# Patient Record
Sex: Male | Born: 1990 | Race: Black or African American | Hispanic: No | Marital: Married | Smoking: Current every day smoker
Health system: Southern US, Community
[De-identification: ages and names within clinical notes are randomized; demographics above are authoritative.]

---
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
## 2010-08-26 ENCOUNTER — Emergency Department: Payer: Self-pay | Admitting: Emergency Medicine

## 2013-07-29 ENCOUNTER — Emergency Department: Payer: Self-pay | Admitting: Emergency Medicine

## 2013-07-29 LAB — RAPID INFLUENZA A&B ANTIGENS

## 2015-04-05 ENCOUNTER — Encounter: Payer: Self-pay | Admitting: Emergency Medicine

## 2015-04-05 ENCOUNTER — Emergency Department
Admission: EM | Admit: 2015-04-05 | Discharge: 2015-04-05 | Disposition: A | Discharge status: Home / Self Care | Payer: Self-pay | Attending: Emergency Medicine | Admitting: Emergency Medicine

## 2015-04-05 DIAGNOSIS — Z720 Tobacco use: Secondary | ICD-10-CM | POA: Insufficient documentation

## 2015-04-05 DIAGNOSIS — G5791 Unspecified mononeuropathy of right lower limb: Secondary | ICD-10-CM | POA: Insufficient documentation

## 2015-04-05 MED ORDER — METHYLPREDNISOLONE 4 MG PO TBPK: ORAL_TABLET | ORAL | Status: DC

## 2015-04-05 NOTE — ED Notes (Signed)
NAD noted at time of D/C. Pt ambulatory to the lobby at this time. Denies comments/concerns at this time.

## 2015-04-05 NOTE — ED Notes (Signed)
Pt reports that he was at work today when he had right knee pain and then started having numbness in right leg.  Numbness has resolved some.

## 2015-04-05 NOTE — ED Provider Notes (Signed)
[facility] Emergency Department Provider Note  ____________________________________________  Time seen: Approximately 8:21 AM  I have reviewed the triage vital signs and the nursing notes.   HISTORY  Chief Complaint Knee Pain and Numbness    HPI Marco Ramos is a 24 y.o. male patient complaining of pain and numbness to the right leg. Patient said is occurred while he was at work. Patient state he does a lot of repetitive squatting and lifting. Patient state is the first time this never happened. Patient states the complaints seem to be resolving since arrival to the ER. Patient denies any pain at this time and states sensation has returned back to his lower leg. No palliative measures taken for this complaint.   No past medical history on file.  There are no active problems to display for this patient.   History reviewed. No pertinent past surgical history.  Current Outpatient Rx  Name  Route  Sig  Dispense  Refill  . methylPREDNISolone (MEDROL DOSEPAK) 4 MG TBPK tablet      Take Tapered dose as directed   21 tablet   0     Allergies Review of patient's allergies indicates no known allergies.  History reviewed. No pertinent family history.  Social History Social History  Substance Use Topics  . Smoking status: Current Some Day Smoker  . Smokeless tobacco: None  . Alcohol Use: None    Review of Systems Constitutional: No fever/chills Eyes: No visual changes. ENT: No sore throat. Cardiovascular: Denies chest pain. Respiratory: Denies shortness of breath. Gastrointestinal: No abdominal pain.  No nausea, no vomiting.  No diarrhea.  No constipation. Genitourinary: Negative for dysuria. Musculoskeletal: Negative for back pain. Skin: Negative for rash. Neurological: Negative for headaches focal weakness. Numbness right lower extremity 0-point ROS otherwise negative.  ____________________________________________   PHYSICAL  EXAM:  VITAL SIGNS: ED Triage Vitals  Enc Vitals Group     BP 04/05/15 0807 130/96 mmHg     Pulse Rate 04/05/15 0807 64     Resp 04/05/15 0807 18     Temp 04/05/15 0807 97.7 F (36.5 C)     Temp Source 04/05/15 0807 Oral     SpO2 04/05/15 0807 100 %     Weight 04/05/15 0807 180 lb (81.647 kg)     Height --      Head Cir --      Peak Flow --      Pain Score --      Pain Loc --      Pain Edu? --      Excl. in GC? --     Constitutional: Alert and oriented. Well appearing and in no acute distress. Eyes: Conjunctivae are normal. PERRL. EOMI. Head: Atraumatic. Nose: No congestion/rhinnorhea. Mouth/Throat: Mucous membranes are moist.  Oropharynx non-erythematous. Neck: No stridor. No cervical spine tenderness to palpation. Hematological/Lymphatic/Immunilogical: No cervical lymphadenopathy. Cardiovascular: Normal rate, regular rhythm. Grossly normal heart sounds.  Good peripheral circulation. Respiratory: Normal respiratory effort.  No retractions. Lungs CTAB. Gastrointestinal: Soft and nontender. No distention. No abdominal bruits. No CVA tenderness. Musculoskeletal: No obvious deformity, no edema, nontender to palpation. Neurovascular intact Neurologic:  Normal speech and language. No gross focal neurologic deficits are appreciated. No gait instability. Skin:  Skin is warm, dry and intact. No rash noted. Psychiatric: Mood and affect are normal. Speech and behavior are normal.  ____________________________________________   LABS (all labs ordered are listed, but only abnormal results are displayed)  Labs Reviewed - No data  to display ____________________________________________  EKG   ____________________________________________  RADIOLOGY   ____________________________________________   PROCEDURES  Procedure(s) performed: None  Critical Care performed: No  ____________________________________________   INITIAL IMPRESSION / ASSESSMENT AND PLAN / ED  COURSE  Pertinent labs & imaging results that were available during my care of the patient were reviewed by me and considered in my medical decision making (see chart for details).  Transit peripheral neuropathy. Patient state complaint has resolved. Advised patient to follow-up with "clinic for any recurrence.Patient given prescription for prednisone. ____________________________________________   FINAL CLINICAL IMPRESSION(S) / ED DIAGNOSES  Final diagnoses:  Neuropathy of right lower extremity      Joni Reining, PA-C 04/05/15 1610  Arnaldo Natal, MD 04/05/15 [phone]

## 2015-06-04 ENCOUNTER — Emergency Department: Payer: Self-pay

## 2015-06-04 ENCOUNTER — Emergency Department
Admission: EM | Admit: 2015-06-04 | Discharge: 2015-06-04 | Disposition: A | Discharge status: Home / Self Care | Payer: Self-pay | Attending: Emergency Medicine | Admitting: Emergency Medicine

## 2015-06-04 DIAGNOSIS — Y9361 Activity, american tackle football: Secondary | ICD-10-CM | POA: Insufficient documentation

## 2015-06-04 DIAGNOSIS — S5001XA Contusion of right elbow, initial encounter: Secondary | ICD-10-CM | POA: Insufficient documentation

## 2015-06-04 DIAGNOSIS — Z720 Tobacco use: Secondary | ICD-10-CM | POA: Insufficient documentation

## 2015-06-04 DIAGNOSIS — Y92321 Football field as the place of occurrence of the external cause: Secondary | ICD-10-CM | POA: Insufficient documentation

## 2015-06-04 DIAGNOSIS — Z79899 Other long term (current) drug therapy: Secondary | ICD-10-CM | POA: Insufficient documentation

## 2015-06-04 DIAGNOSIS — Y998 Other external cause status: Secondary | ICD-10-CM | POA: Insufficient documentation

## 2015-06-04 DIAGNOSIS — W2181XA Striking against or struck by football helmet, initial encounter: Secondary | ICD-10-CM | POA: Insufficient documentation

## 2015-06-04 MED ORDER — HYDROCODONE-ACETAMINOPHEN 5-325 MG PO TABS
1.0000 | ORAL_TABLET | ORAL | Status: DC | PRN
Start: 2015-06-04 — End: 2016-08-19

## 2015-06-04 MED ORDER — IBUPROFEN 800 MG PO TABS: 800.0000 mg | ORAL_TABLET | Freq: Three times a day (TID) | ORAL | Status: DC | PRN

## 2015-06-04 NOTE — ED Notes (Signed)
Pt was playing football yesterday and was hit in the right elbow with another player's helmet. Pt states he is unable to bend his arm at the elbow. Pt alert & oriented with NAD noted.

## 2015-06-04 NOTE — ED Notes (Signed)
Pt ambulatory to triage with c/o right sided elbow pain and swelling  Pt injured it while playing football

## 2015-06-04 NOTE — ED Provider Notes (Signed)
[facility] Emergency Department Provider Note  ____________________________________________  Time seen: Approximately 3:58 PM  I have reviewed the triage vital signs and the nursing notes.   HISTORY  Chief Complaint Elbow Pain    HPI Marco Ramos is a 24 y.o. male who presents for evaluation of right elbow pain. Patient states that he was playing football and got hit in the elbow with another player's helmet. Unable to flex his arm.   No past medical history on file.  There are no active problems to display for this patient.   No past surgical history on file.  Current Outpatient Rx  Name  Route  Sig  Dispense  Refill  . HYDROcodone-acetaminophen (NORCO) 5-325 MG tablet   Oral   Take 1-2 tablets by mouth every 4 (four) hours as needed for moderate pain.   15 tablet   0   . ibuprofen (ADVIL,MOTRIN) 800 MG tablet   Oral   Take 1 tablet (800 mg total) by mouth every 8 (eight) hours as needed.   30 tablet   0   . methylPREDNISolone (MEDROL DOSEPAK) 4 MG TBPK tablet      Take Tapered dose as directed   21 tablet   0     Allergies Review of patient's allergies indicates no known allergies.  No family history on file.  Social History Social History  Substance Use Topics  . Smoking status: Current Some Day Smoker  . Smokeless tobacco: Not on file  . Alcohol Use: Not on file    Review of Systems Constitutional: No fever/chills Eyes: No visual changes. ENT: No sore throat. Cardiovascular: Denies chest pain. Respiratory: Denies shortness of breath. Gastrointestinal: No abdominal pain.  No nausea, no vomiting.  No diarrhea.  No constipation. Genitourinary: Negative for dysuria. Musculoskeletal: Positive for right elbow pain. Skin: Negative for rash. Neurological: Negative for headaches, focal weakness or numbness.  10-point ROS otherwise negative.  ____________________________________________   PHYSICAL EXAM:  VITAL  SIGNS: ED Triage Vitals  Enc Vitals Group     BP 06/04/15 1452 131/79 mmHg     Pulse Rate 06/04/15 1452 75     Resp --      Temp 06/04/15 1452 98.4 F (36.9 C)     Temp Source 06/04/15 1452 Oral     SpO2 06/04/15 1452 99 %     Weight 06/04/15 1452 180 lb (81.647 kg)     Height 06/04/15 1452  (1.778 m)     Head Cir --      Peak Flow --      Pain Score 06/04/15 1453 8     Pain Loc --      Pain Edu? --      Excl. in GC? --     Constitutional: Alert and oriented. Well appearing and in no acute distress. Cardiovascular: Normal rate, regular rhythm. Grossly normal heart sounds.  Good peripheral circulation. Respiratory: Normal respiratory effort.  No retractions. Lungs CTAB. Musculoskeletal: Right elbow tenderness edema and warmth. Limited range of motion. Distally neurovascularly intact. Neurologic:  Normal speech and language. No gross focal neurologic deficits are appreciated. No gait instability. Skin:  Skin is warm, dry and intact. No rash noted. Psychiatric: Mood and affect are normal. Speech and behavior are normal.  ____________________________________________   LABS (all labs ordered are listed, but only abnormal results are displayed)  Labs Reviewed - No data to display ____________________________________________  RADIOLOGY  Negative for acute fracture. ____________________________________________   PROCEDURES  Procedure(s) performed: None  Critical Care performed: No  ____________________________________________   INITIAL IMPRESSION / ASSESSMENT AND PLAN / ED COURSE  Pertinent labs & imaging results that were available during my care of the patient were reviewed by me and considered in my medical decision making (see chart for details).  Acute right elbow contusion. Length provided to use as needed for comfort. Rx given for Motrin 800 mg 3 times a day, hydrocodone 5/325 as needed for pain patient follow-up with PCP or return to the ER with any  worsening symptomology. he is given for 48 hours. Patient voices no other emergency medical complaints at this time. ____________________________________________   FINAL CLINICAL IMPRESSION(S) / ED DIAGNOSES  Final diagnoses:  Elbow contusion, right, initial encounter      Evangeline DakinCharles M Jaylnn Ullery, PA-C 06/04/15 1702  Myrna Blazeravid Matthew Schaevitz, MD 06/04/15 2154

## 2015-06-04 NOTE — Discharge Instructions (Signed)
Elbow Contusion °An elbow contusion is a deep bruise of the elbow. Contusions are the result of an injury that caused bleeding under the skin. The contusion may turn blue, purple, or yellow. Minor injuries will give you a painless contusion, but more severe contusions may stay painful and swollen for a few weeks.  °CAUSES  °An elbow contusion comes from a direct force to that area, such as falling on the elbow. °SYMPTOMS  °· Swelling and redness of the elbow. °· Bruising of the elbow area. °· Tenderness or soreness of the elbow. °DIAGNOSIS  °You will have a physical exam and will be asked about your history. You may need an X-ray of your elbow to look for a broken bone (fracture).  °TREATMENT  °A sling or splint may be needed to support your injury. Resting, elevating, and applying cold compresses to the elbow area are often the best treatments for an elbow contusion. Over-the-counter medicines may also be recommended for pain control. °HOME CARE INSTRUCTIONS  °· Put ice on the injured area. °¨ Put ice in a plastic bag. °¨ Place a towel between your skin and the bag. °¨ Leave the ice on for 15-20 minutes, 03-04 times a day. °· Only take over-the-counter or prescription medicines for pain, discomfort, or fever as directed by your caregiver. °· Rest your injured elbow until the pain and swelling are better. °· Elevate your elbow to reduce swelling. °· Apply a compression wrap as directed by your caregiver. This can help reduce swelling and motion. You may remove the wrap for sleeping, showers, and baths. If your fingers become numb, cold, or blue, take the wrap off and reapply it more loosely. °· Use your elbow only as directed by your caregiver. You may be asked to do range of motion exercises. Do them as directed. °· See your caregiver as directed. It is very important to keep all follow-up appointments in order to avoid any long-term problems with your elbow, including chronic pain or inability to move your elbow  normally. °SEEK IMMEDIATE MEDICAL CARE IF:  °· You have increased redness, swelling, or pain in your elbow. °· Your swelling or pain is not relieved with medicines. °· You have swelling of the hand and fingers. °· You are unable to move your fingers or wrist. °· You begin to lose feeling in your hand or fingers. °· Your fingers or hand become cold or blue. °MAKE SURE YOU:  °· Understand these instructions. °· Will watch your condition. °· Will get help right away if you are not doing well or get worse. °  °This information is not intended to replace advice given to you by your health care provider. Make sure you discuss any questions you have with your health care provider. °  °Document Released: 06/23/2006 Document Revised: 10/07/2011 Document Reviewed: 02/27/2015 °Elsevier Interactive Patient Education ©2016 Elsevier Inc. ° °

## 2015-06-04 NOTE — ED Notes (Signed)
Pt discharged home after verbalizing understanding of discharge instructions; nad noted. 

## 2015-07-06 ENCOUNTER — Encounter: Payer: Self-pay | Admitting: Emergency Medicine

## 2015-07-06 ENCOUNTER — Emergency Department
Admission: EM | Admit: 2015-07-06 | Discharge: 2015-07-06 | Disposition: A | Discharge status: Home / Self Care | Payer: Self-pay | Attending: Emergency Medicine | Admitting: Emergency Medicine

## 2015-07-06 DIAGNOSIS — F430 Acute stress reaction: Secondary | ICD-10-CM | POA: Insufficient documentation

## 2015-07-06 DIAGNOSIS — F4329 Adjustment disorder with other symptoms: Secondary | ICD-10-CM

## 2015-07-06 DIAGNOSIS — F17200 Nicotine dependence, unspecified, uncomplicated: Secondary | ICD-10-CM | POA: Insufficient documentation

## 2015-07-06 DIAGNOSIS — F1410 Cocaine abuse, uncomplicated: Secondary | ICD-10-CM | POA: Insufficient documentation

## 2015-07-06 DIAGNOSIS — F329 Major depressive disorder, single episode, unspecified: Secondary | ICD-10-CM | POA: Insufficient documentation

## 2015-07-06 DIAGNOSIS — F4320 Adjustment disorder, unspecified: Secondary | ICD-10-CM | POA: Insufficient documentation

## 2015-07-06 LAB — CBC
HEMATOCRIT: 48.2 % (ref 40.0–52.0)
HEMOGLOBIN: 16.1 g/dL (ref 13.0–18.0)
MCH: 30.6 pg (ref 26.0–34.0)
MCHC: 33.4 g/dL (ref 32.0–36.0)
MCV: 91.5 fL (ref 80.0–100.0)
Platelets: 157 10*3/uL (ref 150–440)
RBC: 5.27 MIL/uL (ref 4.40–5.90)
RDW: 13.4 % (ref 11.5–14.5)
WBC: 8.9 10*3/uL (ref 3.8–10.6)

## 2015-07-06 LAB — COMPREHENSIVE METABOLIC PANEL
ALBUMIN: 4.6 g/dL (ref 3.5–5.0)
ALK PHOS: 61 U/L (ref 38–126)
ALT: 26 U/L (ref 17–63)
ANION GAP: 7 (ref 5–15)
AST: 39 U/L (ref 15–41)
BILIRUBIN TOTAL: 0.6 mg/dL (ref 0.3–1.2)
BUN: 11 mg/dL (ref 6–20)
CO2: 31 mmol/L (ref 22–32)
Calcium: 9.5 mg/dL (ref 8.9–10.3)
Chloride: 101 mmol/L (ref 101–111)
Creatinine, Ser: 1.07 mg/dL (ref 0.61–1.24)
GFR calc Af Amer: >60 mL/min (ref >60)
GFR calc non Af Amer: >60 mL/min (ref >60)
GLUCOSE: 95 mg/dL (ref 65–99)
POTASSIUM: 3.8 mmol/L (ref 3.5–5.1)
SODIUM: 139 mmol/L (ref 135–145)
TOTAL PROTEIN: 7.4 g/dL (ref 6.5–8.1)

## 2015-07-06 LAB — URINE DRUG SCREEN, QUALITATIVE (ARMC ONLY)
AMPHETAMINES, UR SCREEN: NOT DETECTED
Barbiturates, Ur Screen: NOT DETECTED
Benzodiazepine, Ur Scrn: NOT DETECTED
COCAINE METABOLITE, UR ~~LOC~~: NOT DETECTED
Cannabinoid 50 Ng, Ur ~~LOC~~: POSITIVE — AB
MDMA (ECSTASY) UR SCREEN: NOT DETECTED
METHADONE SCREEN, URINE: NOT DETECTED
OPIATE, UR SCREEN: NOT DETECTED
PHENCYCLIDINE (PCP) UR S: NOT DETECTED
Tricyclic, Ur Screen: NOT DETECTED

## 2015-07-06 LAB — ETHANOL: Alcohol, Ethyl (B): <5 mg/dL (ref <5)

## 2015-07-06 LAB — ACETAMINOPHEN LEVEL

## 2015-07-06 LAB — SALICYLATE LEVEL: Salicylate Lvl: <4 mg/dL (ref 2.8–30.0)

## 2015-07-06 NOTE — ED Provider Notes (Signed)
[facility] Emergency Department Provider Note  Time seen: 9:30 PM  I have reviewed the triage vital signs and the nursing notes.   HISTORY  Chief Complaint Psychiatric Evaluation    HPI Marco Ramos is a 24 y.o. male with no past medical history who presents the emergency department feeling overwhelmed. 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 He states they are getting behind on bills, and it has become very financially stressful. He feels like his stress was through the roof today, and he stated he needed to get out of the house and see or talk to someone who could help him so he came to the emergency department. He denies any SI or HI. Denies ever having thoughts of hurting himself in the past. Denies any medical complaints today. Denies any alcohol or drug use.     History reviewed. No pertinent past medical history.  There are no active problems to display for this patient.   History reviewed. No pertinent past surgical history.  Current Outpatient Rx  Name  Route  Sig  Dispense  Refill  . HYDROcodone-acetaminophen (NORCO) 5-325 MG tablet   Oral   Take 1-2 tablets by mouth every 4 (four) hours as needed for moderate pain.   15 tablet   0   . ibuprofen (ADVIL,MOTRIN) 800 MG tablet   Oral   Take 1 tablet (800 mg total) by mouth every 8 (eight) hours as needed.   30 tablet   0   . methylPREDNISolone (MEDROL DOSEPAK) 4 MG TBPK tablet      Take Tapered dose as directed   21 tablet   0     Allergies Review of patient's allergies indicates no known allergies.  History reviewed. No pertinent family history.  Social History Social History  Substance Use Topics  . Smoking status: Current Some Day Smoker  . Smokeless tobacco: None  . Alcohol Use: Yes     Comment: occassional    Review of Systems Constitutional: Negative for fever. Cardiovascular: Negative for  chest pain. Respiratory: Negative for shortness of breath. Gastrointestinal: Negative for abdominal pain Musculoskeletal: Negative for back pain. Neurological: Negative for headaches, focal weakness or numbness. Psychiatric: Admits significant stress, denies SI or HI. 10-point ROS otherwise negative.  ____________________________________________   PHYSICAL EXAM:  VITAL SIGNS: ED Triage Vitals  Enc Vitals Group     BP 07/06/15 2014 154/82 mmHg     Pulse Rate 07/06/15 2014 48     Resp 07/06/15 2014 18     Temp 07/06/15 2014 97.9 F (36.6 C)     Temp src --      SpO2 07/06/15 2014 99 %     Weight 07/06/15 2014 185 lb (83.915 kg)     Height 07/06/15 2014 5\' 10"  (1.778 m)     Head Cir --      Peak Flow --      Pain Score 07/06/15 2015 0     Pain Loc --      Pain Edu? --      Excl. in GC? --     Constitutional: Alert and oriented. Well appearing and in no distress. Eyes: Normal exam ENT   Head: Normocephalic and atraumatic.   Mouth/Throat: Mucous membranes are moist. Cardiovascular: Normal rate, regular rhythm. No murmur Respiratory: Normal respiratory effort without tachypnea nor retractions. Breath sounds are clear Gastrointestinal: Soft and nontender. No distention.  Musculoskeletal: Nontender with normal range of motion in all extremities. Neurologic:  Normal speech and language. No gross focal neurologic deficits Psychiatric: Mood and affect are normal. Speech and behavior are normal.   ____________________________________________   INITIAL IMPRESSION / ASSESSMENT AND PLAN / ED COURSE  Pertinent labs & imaging results that were available during my care of the patient were reviewed by me and considered in my medical decision making (see chart for details).  Patient persist emergency department feeling overwhelmed and stressed. Denies any SI or HI. Denies any medical complaints. In speaking with the patient he is very calm, cooperative, states he feels much  better now after getting out of the house for little while. Behavior health has seen the patient, and have given him resources for outpatient follow-up. I have given the patient the option of staying in the emergency department speaking with the psychiatrist in the morning versus following up as an outpatient, the patient much prefers to go home as it is his son's birthday today. Patient appears well currently, does not appear to be a threat to himself or anybody else. We'll discharge the patient home with outpatient resources.  ____________________________________________   FINAL CLINICAL IMPRESSION(S) / ED DIAGNOSES  Stress Depression   Minna Antis, MD 07/06/15 2132

## 2015-07-06 NOTE — Discharge Instructions (Signed)
Please follow-up with the outpatient resources provided to you while in the emergency department. If you have any thoughts of hurting herself or anyone else, please return to the emergency department immediately so that we may attempt to help you.   Neuropsychological Examination A neuropsychological examination is a series of tests related to thinking and behavior. It is done to see how the brain works. The exam may be done on people who have had a brain injury or trauma or who have a disease or disorder that affects the brain. The results will help your health care provider diagnose any problems and put together a helpful treatment or assistance plan. LET [facility] KNOW ABOUT:   Any allergies you have.  Your sports and activities.  Recent injuries or surgery.  All medicines you are taking, including vitamins, herbs, eye drops, creams, and over-the-counter medicines.  Medical conditions you have.  Any other issues or behaviors that might affect your ability to take the exam. BEFORE THE PROCEDURE   Ask your health care provider about how medicines you are taking may affect the exam results.  Prepare a list of current medicines to bring to the exam.  Ask a family member or friend to come to the test if you have trouble remembering family history or any other details.  Prepare any records of previous neurological testing, including CT or MRI scans, to bring to the exam.  Get a good night's rest before the exam.  Do not drink alcohol 24 hours before the exam.  Let the examiner know about any other issues or behaviors that might affect your ability to take the exam. THE PROCEDURE The test is usually performed at an outpatient clinic. You will sit at a table for most of the exam. It can also be done bedside in a hospital setting, if necessary. During the exam, you will answer questions, make choices, read, write, and complete simple physical tasks. You might also use a  computer-guided lesson. There is no pain, needles, or electrodes. A trained examiner will be there.  There are several areas, or modules, to the exam. The examiner will administer the ones that most closely relate to the reason for your referral and the disease or symptoms. The examination may take several hours depending on the areas being tested. AFTER THE PROCEDURE   You may go home after the exam.  Follow up with your health care provider for the results.   This information is not intended to replace advice given to you by your health care provider. Make sure you discuss any questions you have with your health care provider.   Document Released: 02/09/2014 Document Reviewed: 02/09/2014 Elsevier Interactive Patient Education [employer]2016 Elsevier Inc.

## 2015-07-06 NOTE — ED Notes (Signed)
Pt to ER with c/o depression.  Denies SI at this time.

## 2015-10-04 ENCOUNTER — Emergency Department
Admission: EM | Admit: 2015-10-04 | Discharge: 2015-10-04 | Disposition: A | Discharge status: Home / Self Care | Payer: Self-pay | Attending: Student | Admitting: Student

## 2015-10-04 ENCOUNTER — Emergency Department: Payer: Self-pay

## 2015-10-04 ENCOUNTER — Encounter: Payer: Self-pay | Admitting: Emergency Medicine

## 2015-10-04 DIAGNOSIS — Z79899 Other long term (current) drug therapy: Secondary | ICD-10-CM | POA: Insufficient documentation

## 2015-10-04 DIAGNOSIS — S93402A Sprain of unspecified ligament of left ankle, initial encounter: Secondary | ICD-10-CM | POA: Insufficient documentation

## 2015-10-04 DIAGNOSIS — Y998 Other external cause status: Secondary | ICD-10-CM | POA: Insufficient documentation

## 2015-10-04 DIAGNOSIS — Y92310 Basketball court as the place of occurrence of the external cause: Secondary | ICD-10-CM | POA: Insufficient documentation

## 2015-10-04 DIAGNOSIS — F17200 Nicotine dependence, unspecified, uncomplicated: Secondary | ICD-10-CM | POA: Insufficient documentation

## 2015-10-04 DIAGNOSIS — X501XXA Overexertion from prolonged static or awkward postures, initial encounter: Secondary | ICD-10-CM | POA: Insufficient documentation

## 2015-10-04 DIAGNOSIS — Y9367 Activity, basketball: Secondary | ICD-10-CM | POA: Insufficient documentation

## 2015-10-04 MED ORDER — NAPROXEN 500 MG PO TABS: 500.0000 mg | ORAL_TABLET | Freq: Two times a day (BID) | ORAL | Status: DC

## 2015-10-04 NOTE — ED Notes (Signed)
States he was playing with his kids yesterday  And twisted left ankle   Ankle swollen and tender to touch . Positive pulses and good sensation noted

## 2015-10-04 NOTE — Discharge Instructions (Signed)
Ankle Sprain °An ankle sprain is an injury to the strong, fibrous tissues (ligaments) that hold your ankle bones together.  °HOME CARE  °· Put ice on your ankle for 1-2 days or as told by your doctor. °¨ Put ice in a plastic bag. °¨ Place a towel between your skin and the bag. °¨ Leave the ice on for 15-20 minutes at a time, every 2 hours while you are awake. °· Only take medicine as told by your doctor. °· Raise (elevate) your injured ankle above the level of your heart as much as possible for 2-3 days. °· Use crutches if your doctor tells you to. Slowly put your own weight on the affected ankle. Use the crutches until you can walk without pain. °· If you have a plaster splint: °¨ Do not rest it on anything harder than a pillow for 24 hours. °¨ Do not put weight on it. °¨ Do not get it wet. °¨ Take it off to shower or bathe. °· If given, use an elastic wrap or support stocking for support. Take the wrap off if your toes lose feeling (numb), tingle, or turn cold or blue. °· If you have an air splint: °¨ Add or let out air to make it comfortable. °¨ Take it off at night and to shower and bathe. °¨ Wiggle your toes and move your ankle up and down often while you are wearing it. °GET HELP IF: °· You have rapidly increasing bruising or puffiness (swelling). °· Your toes feel very cold. °· You lose feeling in your foot. °· Your medicine does not help your pain. °GET HELP RIGHT AWAY IF:  °· Your toes lose feeling (numb) or turn blue. °· You have severe pain that is increasing. °MAKE SURE YOU:  °· Understand these instructions. °· Will watch your condition. °· Will get help right away if you are not doing well or get worse. °  °This information is not intended to replace advice given to you by your health care provider. Make sure you discuss any questions you have with your health care provider. °  °Document Released: 01/01/2008 Document Revised: 08/05/2014 Document Reviewed: 01/27/2012 °Elsevier Interactive Patient  Education ©2016 Elsevier Inc. ° °

## 2015-10-04 NOTE — ED Provider Notes (Signed)
[facility] Emergency Department Provider Note  ____________________________________________  Time seen: Approximately 11:10 AM  I have reviewed the triage vital signs and the nursing notes.   HISTORY  Chief Complaint Ankle Pain    HPI Marco Ramos is a 25 y.o. male left ankle edema secondary to a twisting incident playing basketball yesterday. Patient stated pain increases with weightbearing. Patient stated there was a history of a distal fibular fracture 7 years ago. Pain is in the same site as the previous fracture. Patient state using  Epsom salts soaks with only mild relief. Patient rated his pain as a 5/10.   History reviewed. No pertinent past medical history.  There are no active problems to display for this patient.   History reviewed. No pertinent past surgical history.  Current Outpatient Rx  Name  Route  Sig  Dispense  Refill  . HYDROcodone-acetaminophen (NORCO) 5-325 MG tablet   Oral   Take 1-2 tablets by mouth every 4 (four) hours as needed for moderate pain.   15 tablet   0   . ibuprofen (ADVIL,MOTRIN) 800 MG tablet   Oral   Take 1 tablet (800 mg total) by mouth every 8 (eight) hours as needed.   30 tablet   0   . methylPREDNISolone (MEDROL DOSEPAK) 4 MG TBPK tablet      Take Tapered dose as directed   21 tablet   0   . naproxen (NAPROSYN) 500 MG tablet   Oral   Take 1 tablet (500 mg total) by mouth 2 (two) times daily with a meal.   20 tablet   00     Allergies Review of patient's allergies indicates no known allergies.  No family history on file.  Social History Social History  Substance Use Topics  . Smoking status: Current Some Day Smoker  . Smokeless tobacco: None  . Alcohol Use: Yes     Comment: occassional    Review of Systems Constitutional: No fever/chills Eyes: No visual changes. ENT: No sore throat. Cardiovascular: Denies chest pain. Respiratory: Denies shortness of  breath. Gastrointestinal: No abdominal pain.  No nausea, no vomiting.  No diarrhea.  No constipation. Genitourinary: Negative for dysuria. Musculoskeletal: Left lateral ankle pain  Skin: Negative for rash. Neurological: Negative for headaches, focal weakness or numbness.  .  ____________________________________________   PHYSICAL EXAM:  VITAL SIGNS: ED Triage Vitals  Enc Vitals Group     BP 10/04/15 1109 135/83 mmHg     Pulse Rate 10/04/15 1109 83     Resp 10/04/15 1109 18     Temp 10/04/15 1109 98.6 F (37 C)     Temp Source 10/04/15 1109 Oral     SpO2 10/04/15 1109 97 %     Weight 10/04/15 1109 180 lb (81.647 kg)     Height 10/04/15 1109 5\' 10"  (1.778 m)     Head Cir --      Peak Flow --      Pain Score --      Pain Loc --      Pain Edu? --      Excl. in GC? --     Constitutional: Alert and oriented. Well appearing and in no acute distress. Eyes: Conjunctivae are normal. PERRL. EOMI. Head: Atraumatic. Nose: No congestion/rhinnorhea. Mouth/Throat: Mucous membranes are moist.  Oropharynx non-erythematous. Neck: No stridor.  No cervical spine tenderness to palpation. Hematological/Lymphatic/Immunilogical: No cervical lymphadenopathy. Cardiovascular: Normal rate, regular rhythm. Grossly normal heart sounds.  Good peripheral circulation.  Respiratory: Normal respiratory effort.  No retractions. Lungs CTAB. Gastrointestinal: Soft and nontender. No distention. No abdominal bruits. No CVA tenderness. Musculoskeletal: No obvious ankle deformity. Moderate edema to the lateral leads. Decreased range of motion with inversion movements.  Neurologic:  Normal speech and language. No gross focal neurologic deficits are appreciated. No gait instability. Skin:  Skin is warm, dry and intact. No rash noted. Psychiatric: Mood and affect are normal. Speech and behavior are normal.  ____________________________________________   LABS (all labs ordered are listed, but only abnormal  results are displayed)  Labs Reviewed - No data to display ____________________________________________  EKG   ____________________________________________  RADIOLOGY  No acute finding. I, Joni Reining, personally viewed and evaluated these images (plain radiographs) as part of my medical decision making, as well as reviewing the written report by the radiologist.  ____________________________________________   PROCEDURES  Procedure(s) performed: None  Critical Care performed: No  ____________________________________________   INITIAL IMPRESSION / ASSESSMENT AND PLAN / ED COURSE  Pertinent labs & imaging results that were available during my care of the patient were reviewed by me and considered in my medical decision making (see chart for details).  Lateral ankle sprain. Discussed x-ray finding with patient. Patient placed in an ankle stirrup splint and advise him to crisis for 2-3 days. Patient given prescription for naproxen. Given a work note for 2 days. [facility] ____________________________________________   FINAL CLINICAL IMPRESSION(S) / ED DIAGNOSES  Final diagnoses:  Ankle sprain, left, initial encounter      Joni Reining, PA-C 10/04/15 1220  Gayla Doss, MD 10/04/15 1544

## 2015-10-04 NOTE — ED Notes (Signed)
Injury to left ankle yesterday

## 2016-08-19 ENCOUNTER — Emergency Department
Admission: EM | Admit: 2016-08-19 | Discharge: 2016-08-19 | Disposition: A | Discharge status: Home / Self Care | Payer: Self-pay | Attending: Emergency Medicine | Admitting: Emergency Medicine

## 2016-08-19 ENCOUNTER — Encounter: Payer: Self-pay | Admitting: *Deleted

## 2016-08-19 DIAGNOSIS — Y929 Unspecified place or not applicable: Secondary | ICD-10-CM | POA: Insufficient documentation

## 2016-08-19 DIAGNOSIS — F17200 Nicotine dependence, unspecified, uncomplicated: Secondary | ICD-10-CM | POA: Insufficient documentation

## 2016-08-19 DIAGNOSIS — Y9389 Activity, other specified: Secondary | ICD-10-CM | POA: Insufficient documentation

## 2016-08-19 DIAGNOSIS — W2101XA Struck by football, initial encounter: Secondary | ICD-10-CM | POA: Insufficient documentation

## 2016-08-19 DIAGNOSIS — Y999 Unspecified external cause status: Secondary | ICD-10-CM | POA: Insufficient documentation

## 2016-08-19 DIAGNOSIS — S01511A Laceration without foreign body of lip, initial encounter: Secondary | ICD-10-CM | POA: Insufficient documentation

## 2016-08-19 MED ORDER — AMOXICILLIN 500 MG PO CAPS: 500.0000 mg | ORAL_CAPSULE | Freq: Three times a day (TID) | ORAL | 0 refills | Status: AC

## 2016-08-19 MED ORDER — LIDOCAINE-EPINEPHRINE-TETRACAINE (LET) SOLUTION
3.0000 mL | Freq: Once | NASAL | Status: AC
  Administered 2016-08-19: 3 mL via TOPICAL
  Filled 2016-08-19: qty 3

## 2016-08-19 NOTE — ED Provider Notes (Signed)
[facility] Emergency Department Provider Note  ____________________________________________  Time seen: Approximately 11:17 AM  I have reviewed the triage vital signs and the nursing notes.   HISTORY  Chief Complaint Lip Laceration    HPI Marco Ramos is a 26 y.o. male that presents to the emergency department with lip laceration after getting hit in the face during football yesterday afternoon. Patient states that lip continues to bleed. He denies any additional injuries. Patient did not lose consciousness. Patient has not taken anything for pain. Patient's last tetanus shot was 2-3 years ago.   History reviewed. No pertinent past medical history.  There are no active problems to display for this patient.   History reviewed. No pertinent surgical history.  Prior to Admission medications   Medication Sig Start Date End Date Taking? Authorizing Provider  amoxicillin (AMOXIL) 500 MG capsule Take 1 capsule (500 mg total) by mouth 3 (three) times daily. 08/19/16 08/29/16  Enid Derry, PA-C    Allergies Patient has no known allergies.  History reviewed. No pertinent family history.  Social History Social History  Substance Use Topics  . Smoking status: Current Some Day Smoker  . Smokeless tobacco: Not on file  . Alcohol use Yes     Comment: occassional     Review of Systems  Constitutional: No fever/chills ENT: No upper respiratory complaints. Cardiovascular: No chest pain. Respiratory: No SOB. Gastrointestinal: No abdominal pain.  No nausea, no vomiting.  Musculoskeletal: Negative for musculoskeletal pain. Skin: Negative for rash,  ecchymosis. Neurological: Negative for headaches   ____________________________________________   PHYSICAL EXAM:  VITAL SIGNS: ED Triage Vitals [08/19/16 1008]  Enc Vitals Group     BP      Pulse      Resp      Temp      Temp src      SpO2      Weight 185 lb (83.9 kg)     Height 5\' 10"  (1.778  m)     Head Circumference      Peak Flow      Pain Score      Pain Loc      Pain Edu?      Excl. in GC?      Constitutional: Alert and oriented. Well appearing and in no acute distress. Eyes: Conjunctivae are normal. PERRL. EOMI. Head: Atraumatic. ENT:      Ears:      Nose: No congestion/rhinnorhea.      Mouth/Throat: Mucous membranes are moist.  1/2 centimeter shave laceration to lower right lip. No injury to tongue or inside of lip. No broken or missing teeth. Neck: No stridor.   Cardiovascular: Normal rate, regular rhythm. Normal S1 and S2.  Good peripheral circulation. Respiratory: Normal respiratory effort without tachypnea or retractions. Lungs CTAB. Good air entry to the bases with no decreased or absent breath sounds. Musculoskeletal: Full range of motion to all extremities. No gross deformities appreciated. Neurologic:  Normal speech and language. No gross focal neurologic deficits are appreciated.  Skin:  Skin is warm, dry. No rash noted. Psychiatric: Mood and affect are normal. Speech and behavior are normal. Patient exhibits appropriate insight and judgement.   ____________________________________________   LABS (all labs ordered are listed, but only abnormal results are displayed)  Labs Reviewed - No data to display ____________________________________________  EKG   ____________________________________________  RADIOLOGY   No results found.  ____________________________________________    PROCEDURES  Procedure(s) performed:    Procedures  Wound was irrigated with normal saline. LET was applied to wound to control bleeding. Dermabond was applied over shave laceration. Wound was dressed.  Medications  lidocaine-EPINEPHrine-tetracaine (LET) solution (3 mLs Topical Given by Other 08/19/16 1139)     ____________________________________________   INITIAL IMPRESSION / ASSESSMENT AND PLAN / ED COURSE  Pertinent labs & imaging results that were  available during my care of the patient were reviewed by me and considered in my medical decision making (see chart for details).  Review of the [place] CSRS was performed in accordance of the [facility] prior to dispensing any controlled drugs.     Patient's diagnosis is consistent with shave laceration. Vital signs and exam are reassuring. Tetanus shot is up-to-date. Education was provided. Patient will be discharged home with prescriptions for amoxicillin. Patient is to follow up with dermatology as directed. Patient is given ED precautions to return to the ED for any worsening or new symptoms.     ____________________________________________  FINAL CLINICAL IMPRESSION(S) / ED DIAGNOSES  Final diagnoses:  Lip laceration, initial encounter      NEW MEDICATIONS STARTED DURING THIS VISIT:  New Prescriptions   AMOXICILLIN (AMOXIL) 500 MG CAPSULE    Take 1 capsule (500 mg total) by mouth 3 (three) times daily.        This chart was dictated using voice recognition software/Dragon. Despite best efforts to proofread, errors can occur which can change the meaning. Any change was purely unintentional.    Enid Derryshley Kylen Ismael, PA-C 08/19/16 1331    Jene Everyobert Kinner, MD 08/19/16 1400

## 2016-08-19 NOTE — ED Notes (Signed)
See triage note    States he he was hit in the lip by another player yesterday around 4 pm  Laceration noted to lip

## 2016-08-19 NOTE — ED Triage Notes (Signed)
States he was playing football and collided with another guy, laceration to right lip

## 2018-03-24 ENCOUNTER — Emergency Department
Admission: EM | Admit: 2018-03-24 | Discharge: 2018-03-24 | Disposition: A | Discharge status: Home / Self Care | Payer: Self-pay | Attending: Emergency Medicine | Admitting: Emergency Medicine

## 2018-03-24 ENCOUNTER — Encounter: Payer: Self-pay | Admitting: Emergency Medicine

## 2018-03-24 DIAGNOSIS — S93402A Sprain of unspecified ligament of left ankle, initial encounter: Secondary | ICD-10-CM | POA: Insufficient documentation

## 2018-03-24 DIAGNOSIS — X58XXXA Exposure to other specified factors, initial encounter: Secondary | ICD-10-CM | POA: Insufficient documentation

## 2018-03-24 DIAGNOSIS — Y999 Unspecified external cause status: Secondary | ICD-10-CM | POA: Insufficient documentation

## 2018-03-24 DIAGNOSIS — F17200 Nicotine dependence, unspecified, uncomplicated: Secondary | ICD-10-CM | POA: Insufficient documentation

## 2018-03-24 DIAGNOSIS — Y9389 Activity, other specified: Secondary | ICD-10-CM | POA: Insufficient documentation

## 2018-03-24 DIAGNOSIS — Y929 Unspecified place or not applicable: Secondary | ICD-10-CM | POA: Insufficient documentation

## 2018-03-24 MED ORDER — NAPROXEN 500 MG PO TABS: 500.0000 mg | ORAL_TABLET | Freq: Two times a day (BID) | ORAL | Status: DC

## 2018-03-24 MED ORDER — NAPROXEN 500 MG PO TABS
500.0000 mg | ORAL_TABLET | Freq: Once | ORAL | Status: AC
  Administered 2018-03-24: 500 mg via ORAL
  Filled 2018-03-24: qty 1

## 2018-03-24 NOTE — ED Triage Notes (Signed)
Pt reports playing ball Sunday and hurt left ankle. States hurts to walk on.

## 2018-03-24 NOTE — ED Provider Notes (Signed)
[facility] Emergency Department Provider Note   ____________________________________________   First MD Initiated Contact with Patient 03/24/18 1317     (approximate)  I have reviewed the triage vital signs and the nursing notes.   HISTORY  Chief Complaint Ankle Pain    HPI Marco Ramos is a 27 y.o. male patient complain of left ankle pain secondary to playing ball 2 days ago.  Patient did pain increased with weightbearing.  Patient state has applied ice to decrease to swelling.  Patient rates pain as a 6/10.  Patient described pain is "aching".  No other palliative measures for complaint.  History reviewed. No pertinent past medical history.  There are no active problems to display for this patient.   History reviewed. No pertinent surgical history.  Prior to Admission medications   Medication Sig Start Date End Date Taking? Authorizing Provider  naproxen (NAPROSYN) 500 MG tablet Take 1 tablet (500 mg total) by mouth 2 (two) times daily with a meal. 03/24/18   Joni ReiningSmith, Ricci Paff K, PA-C    Allergies Patient has no known allergies.  No family history on file.  Social History Social History   Tobacco Use  . Smoking status: Current Some Day Smoker  Substance Use Topics  . Alcohol use: Yes    Comment: occassional  . Drug use: Not on file    Review of Systems Constitutional: No fever/chills Eyes: No visual changes. ENT: No sore throat. Cardiovascular: Denies chest pain. Respiratory: Denies shortness of breath. Gastrointestinal: No abdominal pain.  No nausea, no vomiting.  No diarrhea.  No constipation. Genitourinary: Negative for dysuria. Musculoskeletal: Left ankle pain. Skin: Negative for rash. Neurological: Negative for headaches, focal weakness or numbness.   ____________________________________________   PHYSICAL EXAM:  VITAL SIGNS: ED Triage Vitals  Enc Vitals Group     BP 03/24/18 1307 118/78     Pulse Rate 03/24/18  1307 77     Resp 03/24/18 1307 20     Temp 03/24/18 1307 98.6 F (37 C)     Temp Source 03/24/18 1307 Oral     SpO2 03/24/18 1307 99 %     Weight 03/24/18 1303 170 lb (77.1 kg)     Height 03/24/18 1303 5\' 10"  (1.778 m)     Head Circumference --      Peak Flow --      Pain Score 03/24/18 1303 6     Pain Loc --      Pain Edu? --      Excl. in GC? --    Constitutional: Alert and oriented. Well appearing and in no acute distress. Gastrointestinal: Soft and nontender. No distention. No abdominal bruits. No CVA tenderness. Musculoskeletal: No obvious deformity to the left ankle.  There is mild edema.  Patient has full equal range of motion.. Skin:  Skin is warm, dry and intact. No rash noted. Psychiatric: Mood and affect are normal. Speech and behavior are normal.  ____________________________________________   LABS (all labs ordered are listed, but only abnormal results are displayed)  Labs Reviewed - No data to display ____________________________________________  EKG   ____________________________________________  RADIOLOGY No acute bony abnormality on x-ray of the left ankle. ED MD interpretation:    Official radiology report(s): No results found.  ____________________________________________   PROCEDURES  Procedure(s) performed: None  Procedures  Critical Care performed: No  ____________________________________________   INITIAL IMPRESSION / ASSESSMENT AND PLAN / ED COURSE  As part of my medical decision making, I reviewed  the following data within the electronic MEDICAL RECORD NUMBER    Left ankle pain secondary to sprain.  Discussed x-ray findings with patient.  Patient given discharge care instruction.  Patient placed in ankle stirrup splint and given a work note.  Patient advised take medication as directed and [facility].      ____________________________________________   FINAL CLINICAL IMPRESSION(S) /  ED DIAGNOSES  Final diagnoses:  Sprain of left ankle, unspecified ligament, initial encounter     ED Discharge Orders         Ordered    naproxen (NAPROSYN) 500 MG tablet  2 times daily with meals     03/24/18 1411           Note:  This document was prepared using Dragon voice recognition software and may include unintentional dictation errors.    Joni Reining, PA-C 03/24/18 1419    Jene Every, MD 03/24/18 [phone]

## 2018-03-24 NOTE — ED Notes (Signed)
Injured left anke playing flagfoot ball over weekend.  Says someone stepped on his heel when he was toe down.  History of fracture of fibula same ankle.

## 2018-03-24 NOTE — ED Notes (Signed)
Stirrup splint to left ankle.

## 2018-08-27 ENCOUNTER — Other Ambulatory Visit: Payer: Self-pay

## 2018-08-27 ENCOUNTER — Encounter: Payer: Self-pay | Admitting: Intensive Care

## 2018-08-27 ENCOUNTER — Emergency Department
Admission: EM | Admit: 2018-08-27 | Discharge: 2018-08-27 | Disposition: A | Discharge status: Home / Self Care | Payer: Self-pay | Attending: Emergency Medicine | Admitting: Emergency Medicine

## 2018-08-27 DIAGNOSIS — J101 Influenza due to other identified influenza virus with other respiratory manifestations: Secondary | ICD-10-CM | POA: Insufficient documentation

## 2018-08-27 DIAGNOSIS — R1110 Vomiting, unspecified: Secondary | ICD-10-CM | POA: Insufficient documentation

## 2018-08-27 DIAGNOSIS — Z87891 Personal history of nicotine dependence: Secondary | ICD-10-CM | POA: Insufficient documentation

## 2018-08-27 DIAGNOSIS — R0981 Nasal congestion: Secondary | ICD-10-CM | POA: Insufficient documentation

## 2018-08-27 DIAGNOSIS — R05 Cough: Secondary | ICD-10-CM | POA: Insufficient documentation

## 2018-08-27 DIAGNOSIS — R6883 Chills (without fever): Secondary | ICD-10-CM | POA: Insufficient documentation

## 2018-08-27 LAB — INFLUENZA PANEL BY PCR (TYPE A & B)
INFLBPCR: NEGATIVE
Influenza A By PCR: POSITIVE — AB

## 2018-08-27 MED ORDER — IBUPROFEN 600 MG PO TABS: 600.0000 mg | ORAL_TABLET | Freq: Three times a day (TID) | ORAL | 0 refills | Status: DC | PRN

## 2018-08-27 MED ORDER — KETOROLAC TROMETHAMINE 60 MG/2ML IM SOLN
60.0000 mg | Freq: Once | INTRAMUSCULAR | Status: AC
  Administered 2018-08-27: 60 mg via INTRAMUSCULAR
  Filled 2018-08-27: qty 2

## 2018-08-27 MED ORDER — OSELTAMIVIR PHOSPHATE 75 MG PO CAPS: 75.0000 mg | ORAL_CAPSULE | Freq: Two times a day (BID) | ORAL | 0 refills | Status: AC

## 2018-08-27 MED ORDER — PSEUDOEPH-BROMPHEN-DM 30-2-10 MG/5ML PO SYRP: 5.0000 mL | ORAL_SOLUTION | Freq: Four times a day (QID) | ORAL | 0 refills | Status: DC | PRN

## 2018-08-27 NOTE — ED Triage Notes (Signed)
Patient c/o flu like symptoms of body aches, chills/sweats, congestion and cough since yesterday. Took benadryl earlier this morning for sore throat.

## 2018-08-27 NOTE — ED Provider Notes (Signed)
[facility] Emergency Department Provider Note   ____________________________________________   First MD Initiated Contact with Patient 08/27/18 1202     (approximate)  I have reviewed the triage vital signs and the nursing notes.   HISTORY  Chief Complaint Influenza    HPI MAEL MILCH III is a 28 y.o. male patient presents with acute onset of body aches, chills/sweats, congestion, and cough.  Onset of complaint was yesterday.  Patient denies nausea.  Patient is a cough spells produce vomiting.  Patient denies diarrhea.  Patient not taken flu shot for this season.  Patient rates his pain as a 10/10.  Patient described the pain is "achy".  No palliative measure for complaint.    History reviewed. No pertinent past medical history.  There are no active problems to display for this patient.   History reviewed. No pertinent surgical history.  Prior to Admission medications   Medication Sig Start Date End Date Taking? Authorizing Provider  brompheniramine-pseudoephedrine-DM 30-2-10 MG/5ML syrup Take 5 mLs by mouth 4 (four) times daily as needed. 08/27/18   Joni Reining, PA-C  ibuprofen (ADVIL,MOTRIN) 600 MG tablet Take 1 tablet (600 mg total) by mouth every 8 (eight) hours as needed. 08/27/18   Joni Reining, PA-C  naproxen (NAPROSYN) 500 MG tablet Take 1 tablet (500 mg total) by mouth 2 (two) times daily with a meal. 03/24/18   Joni Reining, PA-C  oseltamivir (TAMIFLU) 75 MG capsule Take 1 capsule (75 mg total) by mouth 2 (two) times daily for 5 days. 08/27/18 09/01/18  Joni Reining, PA-C    Allergies Patient has no known allergies.  History reviewed. No pertinent family history.  Social History Social History   Tobacco Use  . Smoking status: Former Games developer  . Smokeless tobacco: Never Used  Substance Use Topics  . Alcohol use: Yes    Comment: occassional  . Drug use: Never    Review of Systems  Constitutional: No fever/chills.  Body  aches Eyes: No visual changes. ENT: No sore throat.  Nasal congestion and cough. Cardiovascular: Denies chest pain. Respiratory: Denies shortness of breath. Gastrointestinal: No abdominal pain.  No nausea, no vomiting.  No diarrhea.  No constipation. Genitourinary: Negative for dysuria. Musculoskeletal: Negative for back pain. Skin: Negative for rash. Neurological: Negative for headaches, focal weakness or numbness.   ____________________________________________   PHYSICAL EXAM:  VITAL SIGNS: ED Triage Vitals  Enc Vitals Group     BP 08/27/18 1143 (!) 143/76     Pulse Rate 08/27/18 1143 82     Resp 08/27/18 1143 14     Temp 08/27/18 1143 99.8 F (37.7 C)     Temp Source 08/27/18 1143 Oral     SpO2 08/27/18 1143 99 %     Weight 08/27/18 1144 190 lb (86.2 kg)     Height 08/27/18 1144 5\' 10"  (1.778 m)     Head Circumference --      Peak Flow --      Pain Score 08/27/18 1144 10     Pain Loc --      Pain Edu? --      Excl. in GC? --     Constitutional: Alert and oriented. Well appearing and in no acute distress. Eyes: Conjunctivae are normal. PERRL. EOMI. Head: Atraumatic. Nose: Bilateral maxillary guarding.   Mouth/Throat: Mucous membranes are moist.  Oropharynx non-erythematous.  Postnasal drainage. Neck: No stridor.  Cardiovascular: Normal rate, regular rhythm. Grossly normal heart sounds.  Good  peripheral circulation. Respiratory: Normal respiratory effort.  No retractions. Lungs CTAB. Gastrointestinal: Soft and nontender. No distention. No abdominal bruits. No CVA tenderness. Skin:  Skin is warm, dry and intact. No rash noted. ____________________________________________   LABS (all labs ordered are listed, but only abnormal results are displayed)  Labs Reviewed  INFLUENZA PANEL BY PCR (TYPE A & B) - Abnormal; Notable for the following components:      Result Value   Influenza A By PCR POSITIVE (*)    All other components within normal limits    ____________________________________________  EKG   ____________________________________________  RADIOLOGY  ED MD interpretation:    Official radiology report(s): No results found.  ____________________________________________   PROCEDURES  Procedure(s) performed: None  Procedures  Critical Care performed: No  ____________________________________________   INITIAL IMPRESSION / ASSESSMENT AND PLAN / ED COURSE  As part of my medical decision making, I reviewed the following data within the electronic MEDICAL RECORD NUMBER     Patient presents acute onset of body aches, chills/sweats, nasal and chest congestion with cough.  Patient has positive influenza A.  Patient given discharge care instruction advised take medication as directed.  Patient [facility].      ____________________________________________   FINAL CLINICAL IMPRESSION(S) / ED DIAGNOSES  Final diagnoses:  Influenza A     ED Discharge Orders         Ordered    oseltamivir (TAMIFLU) 75 MG capsule  2 times daily     08/27/18 1329    brompheniramine-pseudoephedrine-DM 30-2-10 MG/5ML syrup  4 times daily PRN     08/27/18 1329    ibuprofen (ADVIL,MOTRIN) 600 MG tablet  Every 8 hours PRN     08/27/18 1329           Note:  This document was prepared using Dragon voice recognition software and may include unintentional dictation errors.    Joni Reining, PA-C 08/27/18 1333    Sharyn Creamer, MD 08/27/18 2130

## 2018-08-27 NOTE — ED Triage Notes (Signed)
Says body aches, cough, runny nose.  Says only vomits if he coughs really hard.  Mask applied.

## 2019-03-10 ENCOUNTER — Emergency Department: Payer: Self-pay

## 2019-03-10 ENCOUNTER — Other Ambulatory Visit: Payer: Self-pay

## 2019-03-10 ENCOUNTER — Encounter: Payer: Self-pay | Admitting: *Deleted

## 2019-03-10 ENCOUNTER — Emergency Department
Admission: EM | Admit: 2019-03-10 | Discharge: 2019-03-10 | Disposition: A | Discharge status: Home / Self Care | Payer: Self-pay | Attending: Emergency Medicine | Admitting: Emergency Medicine

## 2019-03-10 DIAGNOSIS — R079 Chest pain, unspecified: Secondary | ICD-10-CM | POA: Insufficient documentation

## 2019-03-10 DIAGNOSIS — Z5321 Procedure and treatment not carried out due to patient leaving prior to being seen by health care provider: Secondary | ICD-10-CM | POA: Insufficient documentation

## 2019-03-10 LAB — CBC
HCT: 43.3 % (ref 39.0–52.0)
Hemoglobin: 14.6 g/dL (ref 13.0–17.0)
MCH: 30.7 pg (ref 26.0–34.0)
MCHC: 33.7 g/dL (ref 30.0–36.0)
MCV: 91 fL (ref 80.0–100.0)
Platelets: 181 10*3/uL (ref 150–400)
RBC: 4.76 MIL/uL (ref 4.22–5.81)
RDW: 13.1 % (ref 11.5–15.5)
WBC: 7.8 10*3/uL (ref 4.0–10.5)
nRBC: 0 % (ref 0.0–0.2)

## 2019-03-10 LAB — BASIC METABOLIC PANEL
Anion gap: 9 (ref 5–15)
BUN: 12 mg/dL (ref 6–20)
CO2: 25 mmol/L (ref 22–32)
Calcium: 9.3 mg/dL (ref 8.9–10.3)
Chloride: 103 mmol/L (ref 98–111)
Creatinine, Ser: 1.27 mg/dL — ABNORMAL HIGH (ref 0.61–1.24)
GFR calc Af Amer: >60 mL/min (ref >60)
GFR calc non Af Amer: >60 mL/min (ref >60)
Glucose, Bld: 88 mg/dL (ref 70–99)
Potassium: 3.9 mmol/L (ref 3.5–5.1)
Sodium: 137 mmol/L (ref 135–145)

## 2019-03-10 LAB — TROPONIN I (HIGH SENSITIVITY): Troponin I (High Sensitivity): <2 ng/L (ref <18)

## 2019-03-10 MED ORDER — SODIUM CHLORIDE 0.9% FLUSH: 3.0000 mL | Freq: Once | INTRAVENOUS | Status: DC

## 2019-03-10 NOTE — ED Triage Notes (Signed)
Pt reports left side chest pain since 11 am today.  cig smoker.  Pt states sob.  No cough.  No n/v  Pain radiates into left side of back   Pt alert  Speech clear.

## 2019-03-11 NOTE — ED Notes (Signed)
Pt called from lobby to be taken to exam room with no reply. STAT desk registration states they think they saw him leaving. Checked outside waiting room but unable to locate pt at this time.

## 2020-01-04 IMAGING — CR CHEST - 2 VIEW
2 series · 2 of 2 positions shown · non-contrast
Comparison: None.

CLINICAL DATA: Chest pain

EXAM:
CHEST - 2 VIEW

[chest pa]
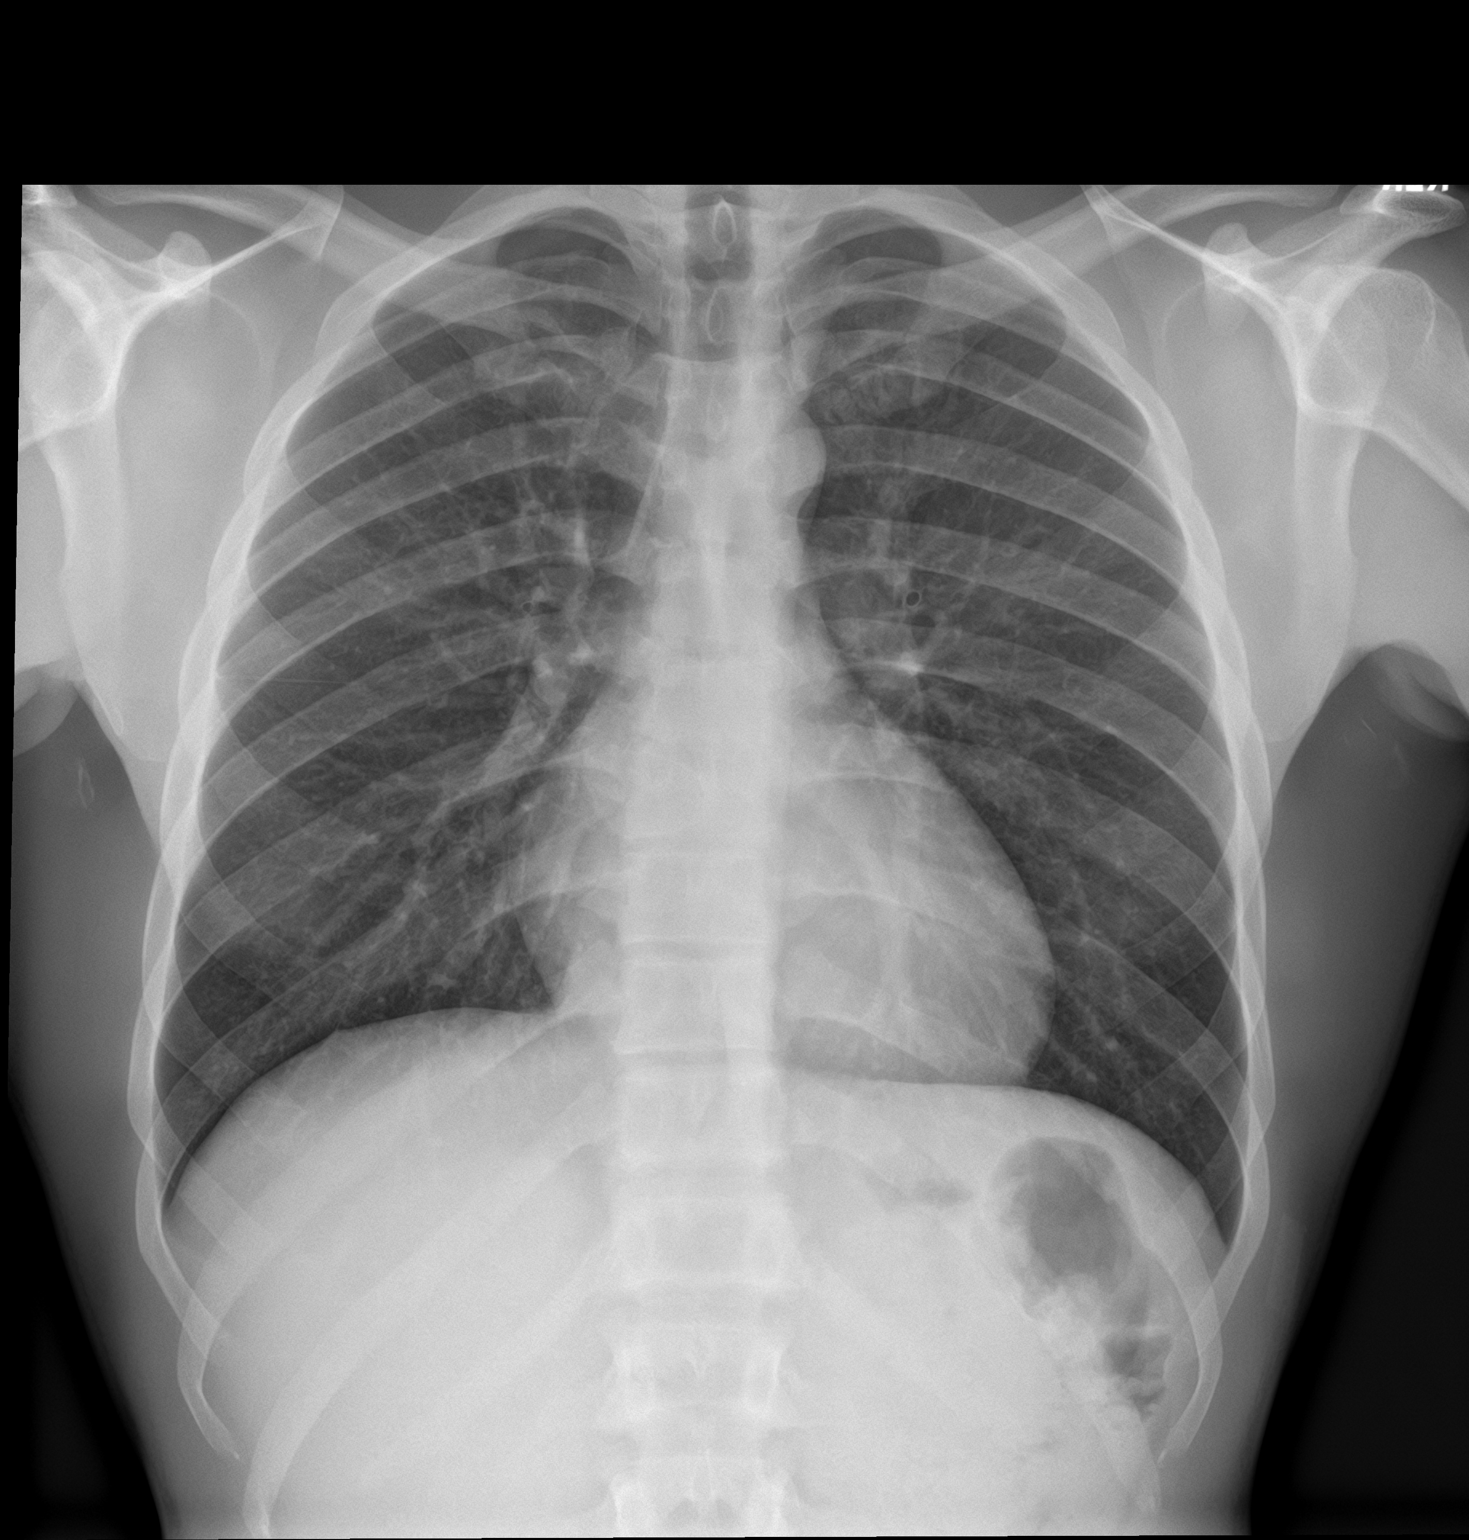

[chest lat]
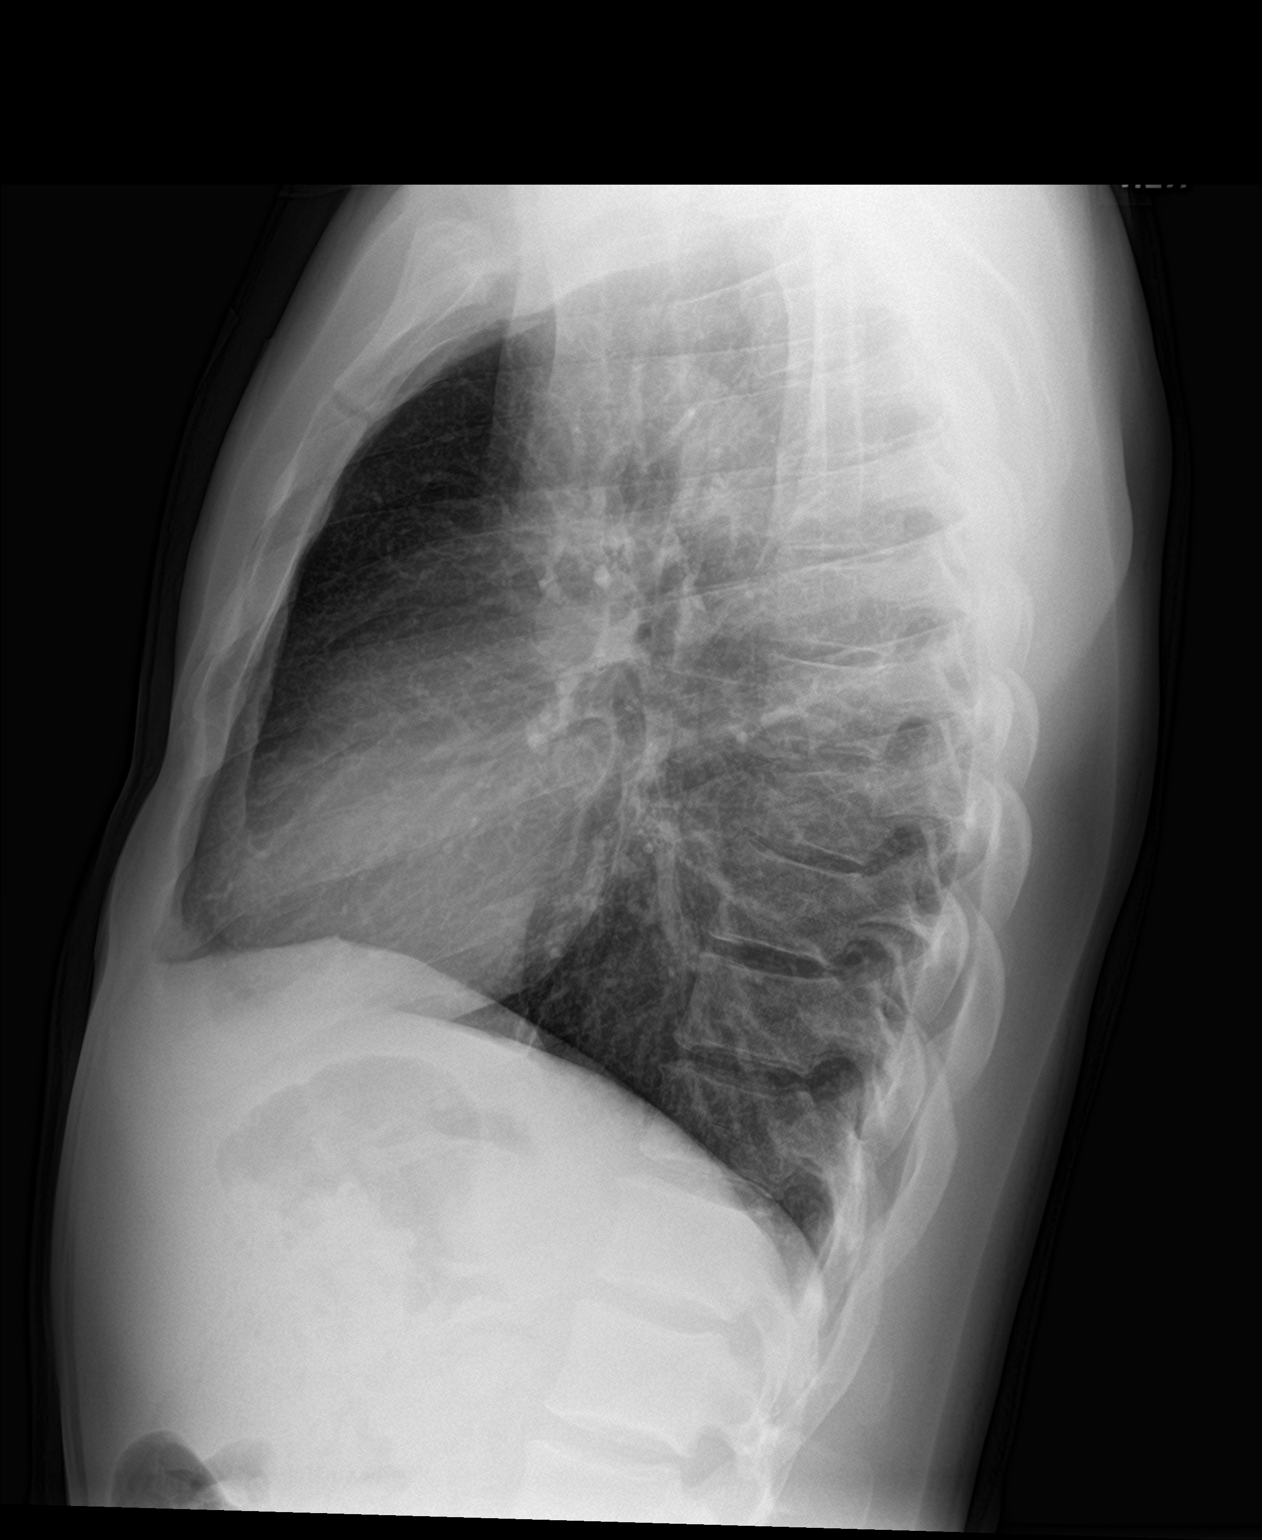

[2 of 2 positions shown; findings below may reference images not displayed]

FINDINGS: Lungs are clear. Heart size and pulmonary vascularity are normal. No
adenopathy. No pneumothorax. No bone lesions.
IMPRESSION: No edema or consolidation.

## 2022-01-20 ENCOUNTER — Emergency Department
Admission: EM | Admit: 2022-01-20 | Discharge: 2022-01-20 | Disposition: A | Discharge status: Home / Self Care | Payer: Medicaid Other | Attending: Student in an Organized Health Care Education/Training Program | Admitting: Student in an Organized Health Care Education/Training Program

## 2022-01-20 ENCOUNTER — Emergency Department: Payer: Medicaid Other

## 2022-01-20 ENCOUNTER — Other Ambulatory Visit: Payer: Self-pay

## 2022-01-20 DIAGNOSIS — W500XXA Accidental hit or strike by another person, initial encounter: Secondary | ICD-10-CM | POA: Insufficient documentation

## 2022-01-20 DIAGNOSIS — Y92321 Football field as the place of occurrence of the external cause: Secondary | ICD-10-CM | POA: Insufficient documentation

## 2022-01-20 DIAGNOSIS — S0101XA Laceration without foreign body of scalp, initial encounter: Secondary | ICD-10-CM | POA: Insufficient documentation

## 2022-01-20 DIAGNOSIS — Y9361 Activity, american tackle football: Secondary | ICD-10-CM | POA: Insufficient documentation

## 2022-01-20 DIAGNOSIS — S0990XA Unspecified injury of head, initial encounter: Secondary | ICD-10-CM

## 2022-01-20 DIAGNOSIS — S0181XA Laceration without foreign body of other part of head, initial encounter: Secondary | ICD-10-CM

## 2022-01-20 MED ORDER — CEPHALEXIN 500 MG PO CAPS: 500.0000 mg | ORAL_CAPSULE | Freq: Three times a day (TID) | ORAL | 0 refills | Status: AC

## 2022-01-20 MED ORDER — LIDOCAINE-EPINEPHRINE-TETRACAINE (LET) TOPICAL GEL
3.0000 mL | Freq: Once | TOPICAL | Status: DC
  Filled 2022-01-20: qty 3

## 2022-04-18 ENCOUNTER — Encounter: Payer: Self-pay | Admitting: Emergency Medicine

## 2022-04-18 ENCOUNTER — Emergency Department: Payer: Medicaid Other

## 2022-04-18 ENCOUNTER — Emergency Department
Admission: EM | Admit: 2022-04-18 | Discharge: 2022-04-18 | Disposition: A | Discharge status: Home / Self Care | Payer: Medicaid Other | Attending: Emergency Medicine | Admitting: Emergency Medicine

## 2022-04-18 ENCOUNTER — Other Ambulatory Visit: Payer: Self-pay

## 2022-04-18 DIAGNOSIS — M25562 Pain in left knee: Secondary | ICD-10-CM | POA: Insufficient documentation

## 2022-04-18 DIAGNOSIS — M25462 Effusion, left knee: Secondary | ICD-10-CM | POA: Insufficient documentation

## 2022-04-18 LAB — SYNOVIAL CELL COUNT + DIFF, W/ CRYSTALS
Crystals, Fluid: NONE SEEN
Eosinophils-Synovial: 0 %
Lymphocytes-Synovial Fld: 4 %
Monocyte-Macrophage-Synovial Fluid: 90 %
Neutrophil, Synovial: 6 %
WBC, Synovial: 269 /mm3 — ABNORMAL HIGH (ref 0–200)

## 2022-04-18 MED ORDER — LIDOCAINE-EPINEPHRINE (PF) 1 %-1:200000 IJ SOLN
10.0000 mL | Freq: Once | INTRAMUSCULAR | Status: AC
  Administered 2022-04-18: 10 mL
  Filled 2022-04-18: qty 10

## 2022-04-18 NOTE — Discharge Instructions (Signed)
Rest, ice, and elevate your leg when possible.  Follow up with orthopedics if not improving over the week.  Return to the ER for symptoms that change, worsen, or for new concerns if unable to schedule an appointment.

## 2022-04-18 NOTE — ED Triage Notes (Signed)
Pt via POV c/o left knee pain and swelling x 1 month, progressively worsening with no known injury. Pt reports limited ROM and "stiffness" with intermittent knot on lateral aspect of left knee. Joint is visibly swollen compared to healthy right knee. Pain reported as 7/10 and sharp/aching. Treated with ibuprofen previously but no meds PTA today.

## 2022-04-18 NOTE — ED Provider Notes (Signed)
[facility] Provider Note    Event Date/Time   First MD Initiated Contact with Patient 04/18/22 1631     (approximate)   History   Leg Pain   HPI ARCH METHOT III is a 31 y.o. male with no significant past medical or surgical history presents to the emergency department for treatment and evaluation of left knee pain and swelling that has been progressively worsening over the past month.  Pain increases with flexion.  No known injury.  No previous injury.  He does work out and run to stay in shape but no high risk activities.      Physical Exam   Triage Vital Signs: ED Triage Vitals  Enc Vitals Group     BP 04/18/22 1554 124/77     Pulse Rate 04/18/22 1554 70     Resp 04/18/22 1554 14     Temp 04/18/22 1554 98.4 F (36.9 C)     Temp Source 04/18/22 1554 Oral     SpO2 04/18/22 1554 98 %     Weight 04/18/22 1555 175 lb (79.4 kg)     Height 04/18/22 1555 5\' 10"  (1.778 m)     Head Circumference --      Peak Flow --      Pain Score 04/18/22 1555 7     Pain Loc --      Pain Edu? --      Excl. in [place]? --     Most recent vital signs: Vitals:   04/18/22 1554 04/18/22 2220  BP: 124/77 127/78  Pulse: 70 73  Resp: 14 16  Temp: 98.4 F (36.9 C)   SpO2: 98% 99%     General: Awake, no distress.  CV:  Good peripheral perfusion.  Resp:  Normal effort.  Abd:  No distention.  Other:  Diffuse edema over the anterior surface of the left knee.  No focal tenderness.  Patient able to demonstrate flexion and extension of the knee.    ED Results / Procedures / Treatments   Labs (all labs ordered are listed, but only abnormal results are displayed) Labs Reviewed  SYNOVIAL CELL COUNT + DIFF, W/ CRYSTALS - Abnormal; Notable for the following components:      Result Value   WBC, Synovial 269 (*)    All other components within normal limits  BODY FLUID CULTURE W GRAM STAIN  GLUCOSE, BODY FLUID OTHER            PROTEIN, BODY FLUID (OTHER)      EKG  Not indicated him   RADIOLOGY Image of the left knee viewed and interpreted by me: Suprapatellar joint effusion Radiology report of the image of the left knee reviewed and is consistent with above.   PROCEDURES:  Critical Care performed: No  .Joint Aspiration/Arthrocentesis  Date/Time: 04/18/2022 5:20 PM  Performed by: 04/20/2022, FNP Authorized by: Chinita Pester, FNP   Consent:    Consent obtained:  Verbal   Consent given by:  Patient   Risks discussed:  Bleeding, infection, pain and incomplete drainage   Alternatives discussed:  Observation and referral Universal protocol:    Procedure explained and questions answered to patient or proxy's satisfaction: yes     Imaging studies available: yes     Patient identity confirmed:  Verbally with patient Location:    Location:  Knee Anesthesia:    Anesthesia method:  Local infiltration   Local anesthetic:  Lidocaine 1% WITH epi (Z track method) Procedure  details:    Preparation: Patient was prepped and draped in usual sterile fashion     Needle gauge:  18 G   Approach:  Superior   Aspirate amount:  >50   Aspirate characteristics:  Yellow   Steroid injected: no     Specimen collected: yes   Post-procedure details:    Dressing:  Adhesive bandage   Procedure completion:  Tolerated well, no immediate complications    MEDICATIONS ORDERED IN ED: Medications  lidocaine-EPINEPHrine (PF) (XYLOCAINE-EPINEPHrine) 1 %-1:200000 (PF) injection 10 mL (10 mLs Infiltration Given by Other 04/18/22 1651)     IMPRESSION / MDM / [place] / ED COURSE  I reviewed the triage vital signs and the nursing notes.                              Differential diagnosis includes, but is not limited to, joint effusion, ligament injury, overuse, arthritis, gout, septic joint  Patient's presentation is most consistent with acute illness / injury with system symptoms.  31 year old male presenting to the emergency  department for treatment and evaluation of left knee swelling and pain that has been progressively worsening over the past month.  See HPI for further details.  On exam, he does have diffuse anterior swelling especially in the suprapatellar region.  He is able to perform flexion and extension.  Patella appears to track midline.  X-ray shows a suprapatellar joint effusion with radiology comment of potential infectious cause.  Arthrocentesis discussed with the patient.  Risks and benefits were described.  Patient endorses verbal consent for the procedure.  Arthrocentesis performed as described above.  Patient tolerated the procedure well.  Sample sent to the pharmacy for synovial cell count with differential, culture Gram stain, protein, and glucose.  Case discussed with Dr. Leim Fabry, on-call orthopedist.  Imaging and synovial fluid analysis reviewed.  White blood cell count 269 with normal neutrophil count.  These are not indicative of septic joint.  No specific cause of the effusion identified but reassured that there is no indication of infection.  Plan will be to have him take Tylenol or ibuprofen and rest, ice, and elevate the extremity until pain and swelling have improved.  Patient is aware and agreeable to this plan.  He will be discharged home with outpatient follow-up information and encouraged to return to the emergency department for symptoms that change or worsen if he is unable to see primary care or the orthopedist.     FINAL CLINICAL IMPRESSION(S) / ED DIAGNOSES   Final diagnoses:  Prepatellar effusion of left knee     Rx / DC Orders   ED Discharge Orders     None        Note:  This document was prepared using Dragon voice recognition software and may include unintentional dictation errors.   Victorino Dike, FNP 04/18/22 2228    Blake Divine, MD 04/19/22 [phone]

## 2022-04-21 LAB — PROTEIN, BODY FLUID (OTHER): Total Protein, Body Fluid Other: 4.3 g/dL

## 2022-04-21 LAB — GLUCOSE, BODY FLUID OTHER: Glucose, Body Fluid Other: 93 mg/dL

## 2022-04-22 LAB — BODY FLUID CULTURE W GRAM STAIN: Culture: NO GROWTH

## 2022-09-29 ENCOUNTER — Encounter: Payer: Self-pay | Admitting: *Deleted

## 2022-09-29 ENCOUNTER — Emergency Department: Payer: Medicaid Other

## 2022-09-29 ENCOUNTER — Other Ambulatory Visit: Payer: Self-pay

## 2022-09-29 ENCOUNTER — Emergency Department
Admission: EM | Admit: 2022-09-29 | Discharge: 2022-09-29 | Disposition: A | Discharge status: Home / Self Care | Payer: Medicaid Other | Attending: Emergency Medicine | Admitting: Emergency Medicine

## 2022-09-29 DIAGNOSIS — R1084 Generalized abdominal pain: Secondary | ICD-10-CM | POA: Insufficient documentation

## 2022-09-29 DIAGNOSIS — R112 Nausea with vomiting, unspecified: Secondary | ICD-10-CM | POA: Insufficient documentation

## 2022-09-29 DIAGNOSIS — Z1152 Encounter for screening for COVID-19: Secondary | ICD-10-CM | POA: Insufficient documentation

## 2022-09-29 LAB — COMPREHENSIVE METABOLIC PANEL
ALT: 18 U/L (ref 0–44)
AST: 28 U/L (ref 15–41)
Albumin: 4.7 g/dL (ref 3.5–5.0)
Alkaline Phosphatase: 55 U/L (ref 38–126)
Anion gap: 12 (ref 5–15)
BUN: 17 mg/dL (ref 6–20)
CO2: 27 mmol/L (ref 22–32)
Calcium: 10 mg/dL (ref 8.9–10.3)
Chloride: 96 mmol/L — ABNORMAL LOW (ref 98–111)
Creatinine, Ser: 1.13 mg/dL (ref 0.61–1.24)
GFR, Estimated: >60 mL/min (ref >60)
Glucose, Bld: 112 mg/dL — ABNORMAL HIGH (ref 70–99)
Potassium: 3.7 mmol/L (ref 3.5–5.1)
Sodium: 135 mmol/L (ref 135–145)
Total Bilirubin: 0.8 mg/dL (ref 0.3–1.2)
Total Protein: 8.6 g/dL — ABNORMAL HIGH (ref 6.5–8.1)

## 2022-09-29 LAB — CBC WITH DIFFERENTIAL/PLATELET
Abs Immature Granulocytes: 0.06 10*3/uL (ref 0.00–0.07)
Basophils Absolute: 0 10*3/uL (ref 0.0–0.1)
Basophils Relative: 0 %
Eosinophils Absolute: 0 10*3/uL (ref 0.0–0.5)
Eosinophils Relative: 0 %
HCT: 46.8 % (ref 39.0–52.0)
Hemoglobin: 16.3 g/dL (ref 13.0–17.0)
Immature Granulocytes: 1 %
Lymphocytes Relative: 7 %
Lymphs Abs: 1 10*3/uL (ref 0.7–4.0)
MCH: 30.9 pg (ref 26.0–34.0)
MCHC: 34.8 g/dL (ref 30.0–36.0)
MCV: 88.6 fL (ref 80.0–100.0)
Monocytes Absolute: 0.9 10*3/uL (ref 0.1–1.0)
Monocytes Relative: 7 %
Neutro Abs: 11.4 10*3/uL — ABNORMAL HIGH (ref 1.7–7.7)
Neutrophils Relative %: 85 %
Platelets: 204 10*3/uL (ref 150–400)
RBC: 5.28 MIL/uL (ref 4.22–5.81)
RDW: 13.1 % (ref 11.5–15.5)
WBC: 13.3 10*3/uL — ABNORMAL HIGH (ref 4.0–10.5)
nRBC: 0 % (ref 0.0–0.2)

## 2022-09-29 LAB — RESP PANEL BY RT-PCR (RSV, FLU A&B, COVID)  RVPGX2
Influenza A by PCR: NEGATIVE
Influenza B by PCR: NEGATIVE
Resp Syncytial Virus by PCR: NEGATIVE
SARS Coronavirus 2 by RT PCR: NEGATIVE

## 2022-09-29 LAB — LIPASE, BLOOD: Lipase: 29 U/L (ref 11–51)

## 2022-09-29 MED ORDER — FAMOTIDINE 20 MG PO TABS
20.0000 mg | ORAL_TABLET | Freq: Once | ORAL | Status: AC
  Administered 2022-09-29: 20 mg via ORAL
  Filled 2022-09-29: qty 1

## 2022-09-29 MED ORDER — LIDOCAINE VISCOUS HCL 2 % MT SOLN
15.0000 mL | Freq: Once | OROMUCOSAL | Status: AC
  Administered 2022-09-29: 15 mL via ORAL
  Filled 2022-09-29: qty 15

## 2022-09-29 MED ORDER — ONDANSETRON HCL 4 MG/2ML IJ SOLN
4.0000 mg | Freq: Once | INTRAMUSCULAR | Status: AC
  Administered 2022-09-29: 4 mg via INTRAVENOUS
  Filled 2022-09-29: qty 2

## 2022-09-29 MED ORDER — ONDANSETRON HCL 4 MG PO TABS: 4.0000 mg | ORAL_TABLET | Freq: Every day | ORAL | 1 refills | Status: AC | PRN

## 2022-09-29 MED ORDER — MORPHINE SULFATE (PF) 4 MG/ML IV SOLN
4.0000 mg | Freq: Once | INTRAVENOUS | Status: AC
  Administered 2022-09-29: 4 mg via INTRAVENOUS
  Filled 2022-09-29: qty 1

## 2022-09-29 MED ORDER — PANTOPRAZOLE SODIUM 40 MG PO TBEC: 40.0000 mg | DELAYED_RELEASE_TABLET | Freq: Every day | ORAL | 1 refills | Status: AC

## 2022-09-29 MED ORDER — SODIUM CHLORIDE 0.9 % IV BOLUS
1000.0000 mL | Freq: Once | INTRAVENOUS | Status: AC
  Administered 2022-09-29: 1000 mL via INTRAVENOUS

## 2022-09-29 MED ORDER — ALUM & MAG HYDROXIDE-SIMETH 200-200-20 MG/5ML PO SUSP
30.0000 mL | Freq: Once | ORAL | Status: AC
  Administered 2022-09-29: 30 mL via ORAL
  Filled 2022-09-29: qty 30

## 2022-09-29 MED ORDER — IOHEXOL 300 MG/ML  SOLN
100.0000 mL | Freq: Once | INTRAMUSCULAR | Status: AC | PRN
  Administered 2022-09-29: 100 mL via INTRAVENOUS

## 2022-09-29 MED ORDER — KETOROLAC TROMETHAMINE 30 MG/ML IJ SOLN
15.0000 mg | Freq: Once | INTRAMUSCULAR | Status: DC
  Filled 2022-09-29: qty 1

## 2022-09-29 NOTE — ED Triage Notes (Signed)
Per EMT report, Patient c/o nausea and vomiting, abdominal pain and flank pain since yesterday. Patient c/o vomiting blood yesterday. Patient was seen at [facility] for the same yesterday.  CBG 123 98.6 15 resp 69 pulse 136/76  100% on room air.

## 2022-09-29 NOTE — Discharge Instructions (Signed)
Thank you for choosing us for your health care today!  Please see your primary doctor this week for a follow up appointment.   Sometimes, in the early stages of certain disease courses it is difficult to detect in the emergency department evaluation -- so, it is important that you continue to monitor your symptoms and call your doctor right away or return to the emergency department if you develop any new or worsening symptoms.  Please go to the following website to schedule new (and existing) patient appointments:   [email or web address]  If you do not have a primary doctor try calling the following clinics to establish care:  If you have insurance:  [facility] [phone] [address] [place] [address]   [facility]  [phone] [address] [place] [address] [place] [address]   If you do not have insurance:  [facility]  [phone] [address] [place] [address]   The following is another list of primary care offices in the area who are accepting new patients at this time.  Please reach out to one of them directly and let them know you would like to schedule an appointment to follow up on an Emergency Department visit, and/or to establish a new primary care provider (PCP).  There are likely other primary care clinics in the are who are accepting new patients, but this is an excellent place to start:  [facility] Lead physician: Dr Angela Bacigalupo [address] [place], [place] [address] [phone]  [facility] Lead Physician: Dr Krichna Sowles [address], [place], [place] [address] [phone]  [facility]  Lead Physician: Dr Megan Johnson [address], [place], [place] [address] [phone]  [facility] [place] [facility] Lead Physician: Dr Alex Karamalegos [address], [place], [place] [address] [phone]  [facility] [place] Lead Physician: Dr Laura Berglund [address], [place],  [address] [phone]   It was my pleasure to care for you today.   Ezrie Bunyan S. [place] Doukas, MD  

## 2022-09-29 NOTE — ED Provider Notes (Signed)
[facility] Provider Note    Event Date/Time   First MD Initiated Contact with Patient 09/29/22 1425     (approximate)   History   Abdominal Pain   HPI  Marco Ramos is a 32 y.o. male   Past medical history of no significant past medical history presents the emergency department with 2 days of abdominal pain, nausea vomiting, shortness of breath and cough.  No fever or chills.  No dysuria.  No diarrhea.  He coughed up and had emesis which was blood-streaked.  He denies chest pain.  He was seen at [facility] emergency department yesterday and discharged. Denies drug or alcohol use.  Denies trauma.    External Medical Documents Reviewed: [facility] emergency department visit dated yesterday with labs significant for elevated white blood cell count, given Zofran.      Physical Exam   Triage Vital Signs: ED Triage Vitals  Enc Vitals Group     BP      Pulse      Resp      Temp      Temp src      SpO2      Weight      Height      Head Circumference      Peak Flow      Pain Score      Pain Loc      Pain Edu?      Excl. in [place]?     Most recent vital signs: Vitals:   09/29/22 1431  BP: 132/79  Pulse: 74  Temp: 98.3 F (36.8 C)  SpO2: 99%    General: Awake, no distress.  CV:  Good peripheral perfusion.  Resp:  Normal effort.  Abd:  No distention.  Other:  Abdomen is soft but he has diffuse tenderness to palpation in all quadrants.  Lungs are clear without focality or wheezing.  Skin appears warm well-perfused.  Awake alert oriented nontoxic-appearing neck is supple with full range of motion.  Oropharynx appears normal without erythema lesions or exudates.   ED Results / Procedures / Treatments   Labs (all labs ordered are listed, but only abnormal results are displayed) Labs Reviewed  COMPREHENSIVE METABOLIC PANEL - Abnormal; Notable for the following components:      Result Value   Chloride 96 (*)    Glucose, Bld 112 (*)     Total Protein 8.6 (*)    All other components within normal limits  CBC WITH DIFFERENTIAL/PLATELET - Abnormal; Notable for the following components:   WBC 13.3 (*)    Neutro Abs 11.4 (*)    All other components within normal limits  RESP PANEL BY RT-PCR (RSV, FLU A&B, COVID)  RVPGX2  LIPASE, BLOOD     I ordered and reviewed the above labs they are notable for blood cell count is elevated at 13.3.   RADIOLOGY I independently reviewed and interpreted chest x-ray see no intra-abdominal free air focality or pneumothorax   PROCEDURES:  Critical Care performed: No  Procedures   MEDICATIONS ORDERED IN ED: Medications  alum & mag hydroxide-simeth (MAALOX/MYLANTA) 200-200-20 MG/5ML suspension 30 mL (has no administration in time range)    And  lidocaine (XYLOCAINE) 2 % viscous mouth solution 15 mL (has no administration in time range)  famotidine (PEPCID) tablet 20 mg (has no administration in time range)  sodium chloride 0.9 % bolus 1,000 mL (1,000 mLs Intravenous New Bag/Given 09/29/22 1439)  ondansetron (ZOFRAN) injection 4  mg (4 mg Intravenous Given 09/29/22 1445)  morphine (PF) 4 MG/ML injection 4 mg (4 mg Intravenous Given 09/29/22 1450)  iohexol (OMNIPAQUE) 300 MG/ML solution 100 mL (100 mLs Intravenous Contrast Given 09/29/22 1525)     IMPRESSION / MDM / ASSESSMENT AND PLAN / ED COURSE  I reviewed the triage vital signs and the nursing notes.                                Patient's presentation is most consistent with acute presentation with potential threat to life or bodily function.  Differential diagnosis includes, but is not limited to, gastroenteritis, GERD/gastritis, intra-abdominal infection like appendicitis, cholecystitis, obstruction, perforation, upper GI bleeding, viral URI like COVID or flu, nephrolithiasis considered but less likely urinary tract infection or testicular torsion   The patient is on the cardiac monitor to evaluate for evidence of arrhythmia  and/or significant heart rate changes.  MDM: This is a patient with abdominal pain nausea and vomiting for the last 2 days.  Some associated cough with blood-streaked emesis as well.  Tender abdomen will get a CT scan with IV contrast.  Check labs including LFTs and lipase.  Medicate with IV fluids, IV Zofran, IV Toradol.  I considered hospitalization for admission or observation since the patient is feeling more comfortable after medications, CAT scan without surgical abdominal pathologies, I think outpatient management follow-up most appropriate.  Discharged with Zofran and Pepcid.  Return precautions given.        FINAL CLINICAL IMPRESSION(S) / ED DIAGNOSES   Final diagnoses:  Generalized abdominal pain     Rx / DC Orders   ED Discharge Orders          Ordered    pantoprazole (PROTONIX) 40 MG tablet  Daily        09/29/22 1555    ondansetron (ZOFRAN) 4 MG tablet  Daily PRN        09/29/22 1555             Note:  This document was prepared using Dragon voice recognition software and may include unintentional dictation errors.    Lucillie Garfinkel, MD 09/29/22 [phone]

## 2024-03-31 ENCOUNTER — Other Ambulatory Visit: Payer: Self-pay

## 2024-03-31 ENCOUNTER — Emergency Department
Admission: EM | Admit: 2024-03-31 | Discharge: 2024-03-31 | Disposition: A | Discharge status: Home / Self Care | Payer: Self-pay | Attending: Emergency Medicine | Admitting: Emergency Medicine

## 2024-03-31 DIAGNOSIS — L0201 Cutaneous abscess of face: Secondary | ICD-10-CM | POA: Insufficient documentation

## 2024-03-31 DIAGNOSIS — L0291 Cutaneous abscess, unspecified: Secondary | ICD-10-CM

## 2024-03-31 MED ORDER — DOXYCYCLINE MONOHYDRATE 100 MG PO TABS: 100.0000 mg | ORAL_TABLET | Freq: Two times a day (BID) | ORAL | 0 refills | Status: AC

## 2024-03-31 MED ORDER — CEPHALEXIN 500 MG PO CAPS: 500.0000 mg | ORAL_CAPSULE | Freq: Four times a day (QID) | ORAL | 0 refills | Status: AC

## 2024-03-31 MED ORDER — LIDOCAINE HCL (PF) 1 % IJ SOLN
5.0000 mL | Freq: Once | INTRAMUSCULAR | Status: AC
  Administered 2024-03-31: 5 mL
  Filled 2024-03-31: qty 5

## 2024-03-31 NOTE — ED Provider Notes (Signed)
 [facility] Provider Note    Event Date/Time   First MD Initiated Contact with Patient 03/31/24 [phone]     (approximate)   History   Abscess   HPI  Marco Ramos is a 33 y.o. male who presents today for evaluation of swelling to his chin for the past 3 to 4 days.  Patient denies any fevers or chills.  No difficulty breathing or swallowing.   There are no active problems to display for this patient.         Physical Exam   Triage Vital Signs: ED Triage Vitals  Encounter Vitals Group     BP 03/31/24 0755 (!) 145/92     Girls Systolic BP Percentile --      Girls Diastolic BP Percentile --      Boys Systolic BP Percentile --      Boys Diastolic BP Percentile --      Pulse Rate 03/31/24 0755 82     Resp 03/31/24 0755 18     Temp 03/31/24 0755 97.9 F (36.6 C)     Temp Source 03/31/24 0755 Oral     SpO2 03/31/24 0755 98 %     Weight 03/31/24 0756 180 lb (81.6 kg)     Height 03/31/24 0756 5' 10 (1.778 m)     Head Circumference --      Peak Flow --      Pain Score 03/31/24 0755 9     Pain Loc --      Pain Education --      Exclude from Growth Chart --     Most recent vital signs: Vitals:   03/31/24 0755 03/31/24 0846  BP: (!) 145/92 138/88  Pulse: 82 80  Resp: 18 17  Temp: 97.9 F (36.6 C) 97.7 F (36.5 C)  SpO2: 98% 98%    Physical Exam Vitals and nursing note reviewed.  Constitutional:      General: Awake and alert. No acute distress.    Appearance: Normal appearance. The patient is normal weight.  HENT:     Head: Normocephalic and atraumatic.     Mouth: Mucous membranes are moist.  Eyes:     General: PERRL. Normal EOMs        Right eye: No discharge.        Left eye: No discharge.     Conjunctiva/sclera: Conjunctivae normal.  Cardiovascular:     Rate and Rhythm: Normal rate and regular rhythm.     Pulses: Normal pulses.  Pulmonary:     Effort: Pulmonary effort is normal. No respiratory distress.     Breath  sounds: Normal breath sounds.  Abdominal:     Abdomen is soft. There is no abdominal tenderness. No rebound or guarding. No distention. Musculoskeletal:        General: No swelling. Normal range of motion.     Cervical back: Normal range of motion and neck supple.  Skin:    General: Skin is warm and dry.     Capillary Refill: Capillary refill takes less than 2 seconds.     Findings: Marble sized area of swelling to the right side of chin without overlying erythema.  Area is fluctuant, with lateral 0.5 cm area indurated Neurological:     Mental Status: The patient is awake and alert.      ED Results / Procedures / Treatments   Labs (all labs ordered are listed, but only abnormal results are displayed) Labs Reviewed - No  data to display   EKG     RADIOLOGY     PROCEDURES:  Critical Care performed:   .Incision and Drainage  Date/Time: 03/31/2024 8:41 AM  Performed by: Cameren Odwyer E, PA-C Authorized by: Rayaan Garguilo E, PA-C   Consent:    Consent obtained:  Verbal   Consent given by:  Patient   Risks, benefits, and alternatives were discussed: yes     Risks discussed:  Bleeding, damage to other organs, infection, incomplete drainage and pain   Alternatives discussed:  No treatment Universal protocol:    Procedure explained and questions answered to patient or proxy's satisfaction: yes     Relevant documents present and verified: yes     Test results available : yes     Required blood products, implants, devices, and special equipment available: yes     Site/side marked: yes     Immediately prior to procedure, a time out was called: yes     Patient identity confirmed:  Verbally with patient Location:    Type:  Abscess   Location:  Head   Head location:  Face Pre-procedure details:    Skin preparation:  Antiseptic wash Sedation:    Sedation type:  None Anesthesia:    Anesthesia method:  Local infiltration   Local anesthetic:  Lidocaine  1% w/o epi Procedure  type:    Complexity:  Complex Procedure details:    Ultrasound guidance: no     Needle aspiration: yes     Needle size:  25 G   Incision types:  Stab incision and single straight   Incision depth:  Dermal   Wound management:  Probed and deloculated and irrigated with saline   Drainage:  Bloody and purulent   Drainage amount:  Moderate   Wound treatment:  Drain placed   Packing materials:  1/4 in iodoform gauze Post-procedure details:    Procedure completion:  Tolerated well, no immediate complications    MEDICATIONS ORDERED IN ED: Medications  lidocaine  (PF) (XYLOCAINE ) 1 % injection 5 mL (5 mLs Infiltration Given by Other 03/31/24 0820)     IMPRESSION / MDM / ASSESSMENT AND PLAN / ED COURSE  I reviewed the triage vital signs and the nursing notes.   Differential diagnosis includes, but is not limited to, abscess, cyst, cellulitis.  Patient is awake and alert, hemodynamically stable and afebrile.  He has a fluctuant area that is marble sized to the right side of his chin, suspicious for abscess.  Recommended I&D which patient is in agreement with.  Area was anesthetized with lidocaine  and incised and drained with copious purulent output.  Leo was placed.  We discussed timeline for wick removal and return precautions.  Given the amount of purulence and there is 1 area that is still indurated, he was placed on antibiotics.  Patient understands and agrees with plan.  Discharged in stable condition.   Patient's presentation is most consistent with acute complicated illness / injury requiring diagnostic workup.    FINAL CLINICAL IMPRESSION(S) / ED DIAGNOSES   Final diagnoses:  Abscess     Rx / DC Orders   ED Discharge Orders          Ordered    cephALEXin  (KEFLEX ) 500 MG capsule  4 times daily        03/31/24 0836    doxycycline  (ADOXA) 100 MG tablet  2 times daily        03/31/24 0836  Note:  This document was prepared using Dragon voice recognition  software and may include unintentional dictation errors.   Timira Bieda E, PA-C 03/31/24 1433    Jacolyn Pae, MD 03/31/24 1440

## 2024-03-31 NOTE — Discharge Instructions (Addendum)
Take antibiotics as prescribed.  Please return for any new, worsening, or change in symptoms or other concerns.  It was a pleasure caring for you today.

## 2024-03-31 NOTE — ED Triage Notes (Signed)
 Patient states he has a hardened area to bottom of chin for about one week; no drainage noted.
